# Patient Record
Sex: Female | Born: 1950 | Race: Asian | Hispanic: No | Smoking: Never smoker
Health system: Southern US, Community
[De-identification: ages and names within clinical notes are randomized; demographics above are authoritative.]

## PROBLEM LIST (undated history)

## (undated) DIAGNOSIS — R519 Headache, unspecified: Secondary | ICD-10-CM

## (undated) DIAGNOSIS — K219 Gastro-esophageal reflux disease without esophagitis: Secondary | ICD-10-CM

## (undated) DIAGNOSIS — I10 Essential (primary) hypertension: Secondary | ICD-10-CM

## (undated) HISTORY — PX: KIDNEY SURGERY: SHX687

## (undated) HISTORY — PX: BILATERAL OOPHORECTOMY: SHX1221

## (undated) HISTORY — DX: Headache, unspecified: R51.9

---

## 1998-12-21 ENCOUNTER — Ambulatory Visit (HOSPITAL_COMMUNITY): Admission: RE | Admit: 1998-12-21 | Discharge: 1998-12-21 | Payer: Self-pay | Admitting: Gastroenterology

## 1998-12-22 ENCOUNTER — Inpatient Hospital Stay (HOSPITAL_COMMUNITY): Admission: RE | Admit: 1998-12-22 | Discharge: 1998-12-25 | Payer: Self-pay | Admitting: *Deleted

## 2000-06-18 ENCOUNTER — Ambulatory Visit (HOSPITAL_COMMUNITY): Admission: RE | Admit: 2000-06-18 | Discharge: 2000-06-18 | Payer: Self-pay | Admitting: Gastroenterology

## 2002-01-31 ENCOUNTER — Other Ambulatory Visit: Admission: RE | Admit: 2002-01-31 | Discharge: 2002-01-31 | Payer: Self-pay | Admitting: *Deleted

## 2003-02-03 ENCOUNTER — Encounter: Admission: RE | Admit: 2003-02-03 | Discharge: 2003-05-04 | Payer: Self-pay | Admitting: Family Medicine

## 2004-03-10 ENCOUNTER — Other Ambulatory Visit: Admission: RE | Admit: 2004-03-10 | Discharge: 2004-03-10 | Payer: Self-pay | Admitting: Endocrinology

## 2004-06-06 ENCOUNTER — Observation Stay (HOSPITAL_COMMUNITY): Admission: EM | Admit: 2004-06-06 | Discharge: 2004-06-07 | Payer: Self-pay | Admitting: Emergency Medicine

## 2005-10-02 ENCOUNTER — Inpatient Hospital Stay (HOSPITAL_COMMUNITY): Admission: EM | Admit: 2005-10-02 | Discharge: 2005-10-06 | Payer: Self-pay | Admitting: Emergency Medicine

## 2005-10-03 ENCOUNTER — Encounter (INDEPENDENT_AMBULATORY_CARE_PROVIDER_SITE_OTHER): Payer: Self-pay | Admitting: Cardiology

## 2005-11-28 ENCOUNTER — Emergency Department (HOSPITAL_COMMUNITY): Admission: EM | Admit: 2005-11-28 | Discharge: 2005-11-28 | Payer: Self-pay | Admitting: Emergency Medicine

## 2010-08-18 ENCOUNTER — Emergency Department (HOSPITAL_BASED_OUTPATIENT_CLINIC_OR_DEPARTMENT_OTHER)
Admission: EM | Admit: 2010-08-18 | Discharge: 2010-08-18 | Payer: Self-pay | Source: Home / Self Care | Admitting: Emergency Medicine

## 2010-09-28 ENCOUNTER — Ambulatory Visit: Payer: Self-pay | Admitting: Physical Therapy

## 2010-10-05 ENCOUNTER — Ambulatory Visit: Payer: PRIVATE HEALTH INSURANCE | Attending: Family Medicine | Admitting: Physical Therapy

## 2010-10-05 DIAGNOSIS — M25569 Pain in unspecified knee: Secondary | ICD-10-CM | POA: Insufficient documentation

## 2010-10-05 DIAGNOSIS — M2569 Stiffness of other specified joint, not elsewhere classified: Secondary | ICD-10-CM | POA: Insufficient documentation

## 2010-10-05 DIAGNOSIS — IMO0001 Reserved for inherently not codable concepts without codable children: Secondary | ICD-10-CM | POA: Insufficient documentation

## 2010-10-05 DIAGNOSIS — M25559 Pain in unspecified hip: Secondary | ICD-10-CM | POA: Insufficient documentation

## 2010-10-10 ENCOUNTER — Ambulatory Visit: Payer: PRIVATE HEALTH INSURANCE | Admitting: Physical Therapy

## 2010-10-12 ENCOUNTER — Ambulatory Visit: Payer: PRIVATE HEALTH INSURANCE | Admitting: Physical Therapy

## 2010-10-17 ENCOUNTER — Ambulatory Visit: Payer: PRIVATE HEALTH INSURANCE | Admitting: Physical Therapy

## 2010-10-19 ENCOUNTER — Ambulatory Visit: Payer: PRIVATE HEALTH INSURANCE | Admitting: Physical Therapy

## 2010-10-24 ENCOUNTER — Ambulatory Visit: Payer: PRIVATE HEALTH INSURANCE | Attending: Family Medicine

## 2010-10-24 DIAGNOSIS — M25559 Pain in unspecified hip: Secondary | ICD-10-CM | POA: Insufficient documentation

## 2010-10-24 DIAGNOSIS — IMO0001 Reserved for inherently not codable concepts without codable children: Secondary | ICD-10-CM | POA: Insufficient documentation

## 2010-10-24 DIAGNOSIS — M2569 Stiffness of other specified joint, not elsewhere classified: Secondary | ICD-10-CM | POA: Insufficient documentation

## 2010-10-24 DIAGNOSIS — M25569 Pain in unspecified knee: Secondary | ICD-10-CM | POA: Insufficient documentation

## 2010-10-26 ENCOUNTER — Ambulatory Visit: Payer: PRIVATE HEALTH INSURANCE

## 2010-10-31 ENCOUNTER — Ambulatory Visit: Payer: PRIVATE HEALTH INSURANCE | Admitting: Physical Therapy

## 2010-11-02 ENCOUNTER — Ambulatory Visit: Payer: PRIVATE HEALTH INSURANCE | Admitting: Physical Therapy

## 2011-01-06 NOTE — Procedures (Signed)
EEG NUMBER:  [id number]   HISTORY:  This is a 60 year old patient who is being evaluated for a  syncopal event. This is a routine EEG. No skull defects were noted.  Medications include Lopressor, Protonix, Zocor, amitriptyline, Vasotec,  hydrochlorothiazide.   EEG CLASSIFICATION:  Dysrhythmia grade 1, left greater than right  hemisphere.   DESCRIPTION OF RECORDING:  The background rhythm of this recording consists  of fairly well modulated medium amplitude alpha rhythm of 8-9 Hz that is  reactive to eye opening and closure. As the record progresses, there appears  to be occasional episodes of generalized theta frequency slowing that occur  in bursts that are brief, 1-2 seconds, and at times are generalized and at  times seem to be a bit more prominent on the left hemisphere than right.  Photic stimulation is performed resulting in a bilateral and symmetric  photic driving response. Hyperventilation is also performed resulting in a  minimal bilateral buildup of the background rhythm activities. At no time  during the recording does there appear to be evidence of actual spikes or  spike wave discharges. EKG monitor shows no evidence of cardiac rhythm  abnormalities with a heart rate of 102.   IMPRESSION:  This is an abnormal EEG recording due to episodic theta  activity that is seen intermittently, at times more prominent in the left  hemisphere than the right. Such recording does not show true epileptiform  discharges; does suggest possible left brain abnormality. No seizure  discharges were seen.      Marlan Palau, M.D.  Electronically Signed     YNW:GNFA  D:  10/05/2005 15:44:14  T:  10/05/2005 17:02:36  Job #:  [id number]

## 2011-01-06 NOTE — Procedures (Signed)
[facility]  Patient:    Debbie Stevens, Debbie Stevens [facility]                     MRN: [id number] Proc. Date: 06/18/00 Adm. Date:  [id number] Attending:  Louie Bun                           Procedure Report  PROCEDURE:  Esophagogastroduodenoscopy.  INDICATION FOR PROCEDURE:  Persistent dyspepsia, recurrent off medication in a patient with a known history of duodenal ulcer and status post treatment for eradications of Helicobacter in the past.  DESCRIPTION OF PROCEDURE:  The patient was placed in the left lateral decubitus position and placed on the pulse monitor with continuous low-flow oxygen delivered by nasal cannula.  She was sedated with 50 mg IV Demerol and 7 mg IV Versed.  The Olympus video endoscope was advanced under direct vision into the oropharynx and esophagus.  The esophagus was straight and of normal caliber.  The squamocolumnar line at 38 cm.  There was no visible hiatal hernia, ring, stricture, esophagitis, or other abnormality at the GE junction. The stomach was entered, and a small amount of liquid secretions were suctioned from the fundus.  Retroflex view of the cardia was unremarkable. The fundus and body appeared normal.  With the antrum was seen some patchy erythema with granularity but no focal erosions, ulcers, or scarring.  The pylorus was nondeformed and easily allowed passage of the endoscope deep into the duodenum.  There was some moderate duodenal bulb cloverleaf deformity but no active ulceration or erosion.  The scope easily passed into the second portion of the duodenum which appeared normal.  The scope was withdrawn into the stomach, and a CLOtest obtained.  The scope was then withdrawn, and the patient returned to the recovery room in stable condition.  She tolerated the procedure well, and there were no immediate complications.  IMPRESSION: 1. Mild antral gastritis. 2. Cloverleaf deformity of the duodenal bulb consistent with  prior peptic    ulcer disease.  PLAN:  Await CLOtest and retreat for eradication if Helicobacter with a Flagyl-based regimen if CLOtest positive.  Otherwise continue proton pump inhibitor.  If dyspepsia continues, will consider ultrasound of the liver, gallbladder, and pancreas. DD:  06/18/00 TD:  06/18/00 Job: [id number]

## 2011-01-06 NOTE — H&P (Signed)
NAMEAARIANNA, Stevens.:  [id number]   MEDICAL RECORD NO.:  [id number]          PATIENT TYPE:  EMS   LOCATION:  MAJO                         FACILITY:  [facility]   PHYSICIAN:  Sherin Quarry, MD      DATE OF BIRTH:  July 04, 1951   DATE OF ADMISSION:  10/02/2005  DATE OF DISCHARGE:                                HISTORY & PHYSICAL   Debbie Stevens is a 60 year old lady who is generally in good health. According to  her daughter, over the last 3 months they have been monitoring of Mrs.  Stevens's blood pressure, and reportedly the diastolic component the blood  pressure was consistently in the range of 95-100. About 6 weeks ago, they  had mentioned this to Dr. Theresia Lo, and he had increased her Toprol dosage  from 50-100 mg daily. Other medications were not changed. Subsequently  according to the daughter, Dr. Theresia Lo had expressed concern about Mrs.  Shippee's thyroid status. Unfortunately, daughter was not present when this  discussion occurred, and Debbie Stevens is not clear about exactly what was  discussed. In any case, it was decided to proceed with a thyroid scan. This  procedure was scheduled for today. Prior to the thyroid scan, Debbie Stevens was  given a tablet, presumably radioiodine, to take, and she was told to go home  and return for the x-ray study. About 30 minutes after taking the tablets,  she indicates that her heart rate increased, and she felt dizzy. She was  hungry and ate a snack. She then said that the dizziness was persisting and  stood up. At that point she said that she felt very weak and sat down on the  floor. Her daughter sat with her and reports that she spontaneously became  diaphoretic and then appeared to lose consciousness. When her daughter  examined her, her eyes had rolled back in her head. She had some grinding of  her teeth which may have represented seizure activity. She would  intermittently regain consciousness when her daughter stimulated her.  This  episode went on for about 15 minutes. When the ambulance arrived, apparently  she was back to her normal status except for being very tired. During the  course of this problem, there was no headache, no visual blurring, no  apparent respiratory distress. No coughing. She has had no chest pain,  although she describes a feeling of pressure or heaviness which she  localizes the epigastric area. There was no vomiting. No hematemesis or  melena. She was transported to the emergency room, arriving about 5 hours  ago. At that time, her blood pressure was 149/63, heart rate was noted to be  104, O2 saturation was 99%. Electrocardiogram was obtained which shows a  normal sinus rhythm with no arrhythmias. No evidence of ischemia or  hypertrophy. Laboratory studies obtained included a sodium 135, potassium  4.3, glucose 116. Hemoglobin was 16.3. Creatinine 1.1. Initial point of care  enzymes were negative. Urinalysis was negative. The chest x-ray was within  normal limits. The CT scan of the brain was completely  normal. Debbie Stevens  will be admitted to the hospital for further evaluation of this syncopal  episode   PAST MEDICAL HISTORY:   CURRENT MEDICATIONS:  Consist of:  1.  Lipitor or 10 mg daily.  2.  Prevacid 30 mg daily.  3.  Toprol XL 50 mg twice daily.  4.  Amitriptyline 825 mg 3 times a day.  5.  Enalapril 5/12.5 one daily.   There are no known drug allergies.   OPERATIONS:  She has had a previous hysterectomy and bilateral salpingo-  oophorectomy. She also had a nephrectomy 18 years ago because of kidney  stones.   MEDICAL ILLNESSES:  1.  Hypertension. The patient has a 6-year history of hypertension which is      generally well regulated. Since her Toprol dosage was increased, her      blood pressure has been consistently in the range of 120-130/70-80.  2.  Acid reflux. Debbie Stevens reports having episodes of epigastric discomfort      for which she takes Prevacid. She says  that lately she has noted that      she has had several episodes where she felt like there was a significant      acid reflux.   FAMILY HISTORY:  The patient has several siblings with hypertension.   SOCIAL HISTORY:  She does not smoke. She does not abuse alcohol or drugs.  She works as a Neurosurgeon.   REVIEW OF SYSTEMS:  HEAD:  She denies headache, otherwise see above. She  denies diplopia. There has been no visual blurring. There has been no  earache, sinus pain or sore throat. CHEST:  She denies coughing wheezing,  chest congestion. CARDIOVASCULAR:  Denies orthopnea, PND, ankle edema,  exertional chest pain. Note that she was hospitalized in October 2005 with  episode of chest pain, but I gather that workup subsequently was negative.  There is not much information on the computer about this hospitalization.  GI: See above. Otherwise she denies hematemesis, abdominal pain, melena or  diarrhea. GU: Denies dysuria or urinary frequency. NEUROLOGIC:  There is no  history of seizure or stroke. ENDOCRINE: Denies excessive thirst, urinary  frequency or nocturia.   PHYSICAL EXAMINATION:  GENERAL:  She is a pleasant cooperative lady who does  not speak [place]. She appears to be a little bit fatigued, but she is able  to follow commands and has no specific complaints.  HEENT: Exam is within normal limits.  CHEST: The chest is clear.  BACK: Examination the back reveals no CVA or point tenderness.  CARDIOVASCULAR On Exam, S1-S2 is seen to be slightly increased. The heart  rate is increased at about 110. It is regular. There are no rubs or gallops.  ABDOMEN: The abdomen is benign. There are normal bowel sounds. No masses or  tenderness. No guarding or rebound.  NEUROLOGIC:  Testing is within normal limits. Cranial nerves, motor, sensory  and cerebellar testing is normal. Station and gait was not tested.  EXTREMITIES: Examination extremities reveals no cyanosis or edema   IMPRESSION:  1.   Syncopal episode in close proximity to a thyroid scan.  2.  Sinus tachycardia.  3.  Possible thyroid disorder, details uncertain.  4.  History of hypertension.  5.  Gastroesophageal reflux.  6.  Status post hysterectomy.   PLAN:  Will admit the patient to the hospital for evaluation. The patient's  tachycardia and diaphoresis would suggest the possibility of  hyperthyroidism. We will need to get more details  from Dr. Theresia Lo about  her previous workup. I will reassess her thyroid function at this time. We  will obtain serial cardiac enzymes to rule out MI. I will  give her some intravenous fluids and continue her beta blocker. A 2-D echo  will be obtained. If workup is otherwise unremarkable, conceivably a  neurology consult might be worthwhile. Will also try to determine from  nuclear medicine if it is possible that she could have had a reaction to the  radionuclide.           ______________________________  Sherin Quarry, MD     SY/MEDQ  D:  10/02/2005  T:  10/02/2005  Job:  [id number]   cc:   Vikki Ports, M.D.  Fax: [phone]

## 2011-01-06 NOTE — H&P (Signed)
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  She presents with a history of onset yesterday  evening of diarrhea, nausea and vomiting shortly after eating.  After this  had been going on for a couple of hours she began to experience a dull  constant chest pressure associated with persistent nausea.  There was no  associated breathing difficulty or diaphoresis.  Although the diarrhea and  vomiting gradually resolved, the patient continued to experience chest  pressure and eventually came to the [facility] Emergency Room at about 5  p.m.  At that time she was given aspirin as well as a total of two  sublingual nitroglycerin.  The nitroglycerin did not clearly benefit the  chest pain but it did gradually resolved.  At this time she is painfree.  She reports that whenever she lifts anything heavy she will experience a  feeling of chest heaviness.  She normally walks on a treadmill about 25  minutes each day and does not experience any shortness of breath or chest  pain.  It is somewhat difficult to get a history from the patient as she  does not speak English, fortunately, we are assisted by her daughter.  Of  note is that the patient has a longstanding history of hypertension.   PAST MEDICAL HISTORY:  1.  Medications:      1.  Vaseretic 5/12.5 one daily.      2.  Toprol XL 50 mg daily.      3.  Elavil 25 mg t.i.d.      4.  Prevacid 30 mg daily.      5.  Vitamin one daily.      6.  Caltrate two tablets daily.  2.  Allergies:  There are no known drug allergies.  3.  Operations:  The  patient has had a previous hysterectomy and bilateral      salpingo-oophorectomy.  She had a nephrectomy about 18 years ago because      of kidney stones.  4.  Medical illnesses:      1.  Hypertension.  The patient has a 4-year history of hypertension          which is said to be reasonably well regulated.  There is no previous          history of heart disease or stroke.      2.  Acid reflux.  The patient has chronic difficulty with midepigastric          burning and sometimes will experience difficulty swallowing with a  feeling that food is hung up in her esophagus.   FAMILY HISTORY:  The patient has several siblings who have hypertension.   SOCIAL HISTORY:  She never smokes.  She denies alcohol use.  She does not  abuse drugs.  As mentioned previously she works as a Neurosurgeon.   REVIEW OF SYSTEMS:  HEAD:  She denies headache or dizziness.  EYES:  She  denies visual blurring or diplopia.  EAR/NOSE AND THROAT:  Denies earache,  sinus pain, or sore throat.  CHEST:  Denies coughing, wheezing, or chest  congestion.  CARDIOVASCULAR:  See above.  She denies orthopnea or PND.  There has been no ankle edema.  GI:  See above.  There has been no  hematemesis or melena.  GU:  She denies dysuria or urinary frequency.  NEURO:  There is no history of seizure or stroke.  ENDO:  She denies  excessive thirst, urinary frequency, nocturia, heat or cold intolerance,  skin or hair changes.   PHYSICAL EXAMINATION:  VITAL SIGNS:  Her blood pressure is 150/100, pulse is  92, respirations 12, O2 saturations 100%.  HEENT:  Within normal limits.  CHEST:  Clear.  CARDIOVASCULAR:  Normal S1 and S2 without rubs, murmurs, or gallops.  ABDOMEN:  Benign.  There are normal bowel sounds without masses or  tenderness.  There is no guarding or rebound.  NEUROLOGIC TESTING/EXAMINATION OF EXTREMITIES:  Normal.   ELECTROCARDIOGRAM:  Evidence of tachycardia.   LABORATORIES:  White count is 10,200, the  prothrombin time is normal,  hemoglobin is 16.4, potassium is 3.2, creatinine is 1.2, BUN 37, liver  profile is normal, the initial troponin is negative.   IMPRESSION:  1.  Atypical chest pain rule out myocardial infarction.  2.  Probable gastroesophageal reflux with symptoms suggestive of an      esophageal stricture.  3.  Hypertension.  4.  Family history of hypertension.  5.  Mild hypokalemia.   PLAN:  I think it would be prudent to go ahead and admit the patient at this  time to rule out myocardial infarction.  I suspect the patient is relatively  dehydrated secondary to diarrhea.  We will give her intravenous fluids and  if the diarrhea persists it would probably be a good idea to get stool  studies.  The patient's potassium level will be repleted.  Eventually she  will need a stress test to evaluate her symptoms.  It should be noted that  she tolerates walking on a treadmill without difficulty.  Also it sounds  like she has pretty significant problems with epigastric pain and dysphagia  and might benefit from another endoscopy.       SY/MEDQ  D:  06/06/2004  T:  06/06/2004  Job:  [id number]   cc:   Vikki Ports, M.D.  [address]  [place]  [place] [address]  Fax: [phone]

## 2011-01-06 NOTE — Discharge Summary (Signed)
Debbie Stevens, Debbie Stevens NO.:  [id number]   MEDICAL RECORD NO.:  [id number]          PATIENT TYPE:  INP   LOCATION:  4735                         FACILITY:  [facility]   PHYSICIAN:  Kela Millin, M.D.DATE OF BIRTH:  July 10, 1951   DATE OF ADMISSION:  10/02/2005  DATE OF DISCHARGE:  10/06/2005                                 DISCHARGE SUMMARY   DISCHARGE DIAGNOSES:  1.  Syncope - etiology unclear, question medication induced.  2.  Hypertension.  3.  Gastroesophageal reflux disease.  4.  History of nephrolithiasis.  5.  Status post nephrectomy.   STUDIES:  1.  CT scan of the head - no acute intracranial abnormality.  2.  MRI of the brain on October 06, 2005 - no acute intracranial      abnormalities.  3.  EEG on October 05, 2005 - abnormal EEG recording due to episodic theta      activity then intermittently at times more prominent in the left      hemisphere than the right. No true epileptiform discharges. No seizure      discharges seen.  4.  A 2D echocardiogram - overall left ventricular systolic function normal.      No left ventricular regional wall motion abnormalities.  5.  Carotid Doppler ultrasound - no ICA stenosis bilaterally. Vertebral      artery flow antegrade bilaterally.   HISTORY:  The patient is a 60 year old [place] lady who presented  following a syncopal episode. It was noted that the patient had generally  been in good health and according to her daughter, in the past 3 months  blood pressures have been monitored by her primary care physician and her  diastolic blood pressures were consistently in the 95-100 range. About 6  weeks prior to this presentation, they mentioned this to Dr. Theresia Lo and  the patient's Toprol dosage was increased. Subsequently Dr. Theresia Lo also  was concerned about Debbie Stevens's thyroid status, so a thyroid scan was  ordered. This study was scheduled on the day of presentation and prior to  the scan the patient  was given a tablet, presumably radio-iodine to take and  she was told to go home and return for the x-ray study. About 30 minutes  after taking the tablet she reported and increase in her heart rate and felt  dizzy. Even after eating the dizziness persisted, so she stood up and at  that point she felt very weak and sat down on the floor. Per her daughter,  she became very diaphoretic and appeared to lose consciousness. Her daughter  reported that when she examined her, her eyes were rolled back in her head  and she had some grinding of her teeth, and the questioned seizure activity.  This episode lasted about 15 minutes during which time the patient  intermittently would gain consciousness when stimulated by her daughter. The  ambulance was called and the patient was transported to the ER. The patient  denied chest pain, she admitted to some heaviness in her epigastric area.  She denied nausea, vomiting, hematemesis.  In the ER her blood pressure was  noted to be 149/63 with a pulse of 104, O2 saturation of 99%. An EKG showed  normal sinus rhythm with no arrhythmias.  She was admitted to the [facility] service for further evaluation and  management.   PHYSICAL EXAMINATION:  VITAL SIGNS:  Upon admission as per Dr. Tresa Endo,  revealed a blood pressure of 149/63 with a pulse of 104.  GENERAL:  She was pleasant, appeared a little bit fatigued and was able to  follow commands. The rest of her exam was noted to be within normal limits.   LABORATORY DATA:  Imaging studies as already stated above, her sodium was  135 with a potassium of 4.3, glucose of 116.  Hemoglobin 16.3. Creatinine 1.1. Cardiac enzymes were negative. Urinalysis  was negative for infection.  Chest x-ray was within normal limits.   HOSPITAL COURSE:  PROBLEM #1. Syncope - upon admission the patient had  serial cardiac enzymes done and these were negative. Orthostatic vital signs  were also done in the hospital and she  was not orthostatic. The patient also  had thyroid function test done, her TSH was normal at 1.033 and her T3  uptake was slightly elevated at 40.3 and a T4 was normal at 7.2. The work up  for the syncopal episode in the hospital included a 2D echo which was within  normal limits as stated above, also carotid Doppler ultrasound showed no  internal carotid artery stenosis. She had an EEG done with results as stated  above. Following this EEG finding, I discussed the patient with neurology  and their recommendation was to do an MRI as a follow-up to rule out a  stroke. An MRI was done and it showed no acute intracranial findings. The  patient did not have any syncopal episodes throughout her hospital stay. Her  blood pressures were monitored during her hospital stay and she was  maintained on Lopressor 50 p.o. b.i.d. and her blood pressures ranged from  99/61 to 124/68. It was noted that the patient's home dose was Toprol XL 50  in the a.m. and Toprol XL 100 in the p.m. The patient was instructed to  discontinue the p.m. Toprol of 100 mg and just take Toprol XL 50 mg q.a.m.  She is to follow up with her primary care physicians and have blood  pressures monitored and the doses adjusted as appropriate. It is likely that  if the patient was actually taking Toprol XL 50 in a.m. and 100 in p.m.,  that she might have become hypotensive (given that her blood pressures were  in the optimal range in the hospital on just 50 of metoprolol b.i.d. along  with her Vasoretic). The other possibility is that the patient might have  had a reaction to the radio-iodine tablet that she was given. I discussed  this with the radiologist and he stated that this was very unlikely because  the dose given for thyroid scan is a very small dose.   PROBLEM #2. Hypertension - patient is to continue Toprol 50 mg daily as  discussed above as well as Vaseretic and follow up with her primary care physician.   PROBLEM #3.  Gastroesophageal reflux disease - patient is to continue  Prevacid upon discharge.   PROBLEM #4. Question of thyroid disease - patient is to follow up with her  primary care physician and with endocrinology consultation as appropriate.   DISCHARGE MEDICATIONS:  1.  Toprol XL 50 mg daily.  2.  Prevacid 30 mg daily.  3.  Lipitor 10 mg daily.  4.  Vaseretic 5/12.5 mg daily.  5.  Amitriptyline 25 mg p.o.  6.  Multivitamin daily.  7.  Calcium daily.   FOLLOW UP CARE:  Dr. Theresia Lo in 1 week and as needed.   DISCHARGE CONDITION:  Improved/stable.      Kela Millin, M.D.  Electronically Signed     ACV/MEDQ  D:  10/06/2005  T:  10/06/2005  Job:  [id number]   cc:   Vikki Ports, M.D.  Fax: [phone]

## 2011-04-24 ENCOUNTER — Emergency Department (INDEPENDENT_AMBULATORY_CARE_PROVIDER_SITE_OTHER): Payer: PRIVATE HEALTH INSURANCE

## 2011-04-24 ENCOUNTER — Other Ambulatory Visit: Payer: Self-pay

## 2011-04-24 ENCOUNTER — Emergency Department (HOSPITAL_BASED_OUTPATIENT_CLINIC_OR_DEPARTMENT_OTHER)
Admission: EM | Admit: 2011-04-24 | Discharge: 2011-04-24 | Disposition: A | Discharge status: Home / Self Care | Payer: PRIVATE HEALTH INSURANCE | Attending: Emergency Medicine | Admitting: Emergency Medicine

## 2011-04-24 ENCOUNTER — Encounter: Payer: Self-pay | Admitting: *Deleted

## 2011-04-24 DIAGNOSIS — I10 Essential (primary) hypertension: Secondary | ICD-10-CM | POA: Insufficient documentation

## 2011-04-24 DIAGNOSIS — R002 Palpitations: Secondary | ICD-10-CM | POA: Insufficient documentation

## 2011-04-24 HISTORY — DX: Essential (primary) hypertension: I10

## 2011-04-24 LAB — TROPONIN I: Troponin I: <0.3 ng/mL (ref <0.30)

## 2011-04-24 LAB — BASIC METABOLIC PANEL
Calcium: 9.9 mg/dL (ref 8.4–10.5)
Chloride: 99 mEq/L (ref 96–112)
Creatinine, Ser: 0.8 mg/dL (ref 0.50–1.10)
GFR calc non Af Amer: >60 mL/min (ref >60)

## 2011-04-24 LAB — CBC
HCT: 40.4 % (ref 36.0–46.0)
MCV: 84.2 fL (ref 78.0–100.0)
WBC: 5.9 10*3/uL (ref 4.0–10.5)

## 2011-04-24 NOTE — ED Provider Notes (Signed)
History     CSN: [id number] Arrival date & time: 04/24/2011  5:03 AM  Chief Complaint  Patient presents with  . Hypertension   Patient is a 60 y.o. female presenting with hypertension. The history is provided by the patient and a relative.  Hypertension This is a chronic problem. The current episode started 12 to 24 hours ago. The problem occurs constantly. The problem has not changed since onset.Pertinent negatives include no chest pain, no abdominal pain, no headaches and no shortness of breath. The symptoms are aggravated by nothing. The symptoms are relieved by nothing. She has tried nothing for the symptoms.   the patient reports that she noticed today that her blood pressure was running high, with a systolic blood pressure ranging from 150-160 mmHg. She denies any symptoms associated with this, including shortness of breath, or chest pain, but reports feeling like her heart was racing, with her measuring a heart rate of 100 beats per minute at times during the day which concerned her. She denies palpitations or fluttering in her chest. She denies nausea, vomiting, headache, visual changes, or other symptoms associated with the high blood pressure. She reports that she has been taking her antihypertensive medications as prescribed.  Past Medical History  Diagnosis Date  . Hypertension     No past surgical history on file.  No family history on file.  History  Substance Use Topics  . Smoking status: Never Smoker   . Smokeless tobacco: Not on file  . Alcohol Use: No    OB History    Grav Para Term Preterm Abortions TAB SAB Ect Mult Living                  Review of Systems  Constitutional: Negative for diaphoresis, activity change and fatigue.  HENT: Negative for ear pain, nosebleeds and tinnitus.   Eyes: Negative.  Negative for pain and visual disturbance.  Respiratory: Negative for chest tightness, shortness of breath and wheezing.   Cardiovascular: Negative for chest  pain, palpitations and leg swelling.  Gastrointestinal: Negative for abdominal pain.  Musculoskeletal: Negative.   Skin: Negative.   Neurological: Negative for dizziness, tremors, syncope, weakness, light-headedness and headaches.  Psychiatric/Behavioral: Negative.     Physical Exam  BP 162/91  Pulse 71  Resp 18  SpO2 97%  Physical Exam  Nursing note and vitals reviewed. Constitutional: She is oriented to person, place, and time. She appears well-nourished. No distress.  HENT:  Head: Normocephalic and atraumatic.  Mouth/Throat: Oropharynx is clear and moist.  Eyes: Pupils are equal, round, and reactive to light.  Neck: Neck supple. No JVD present.  Cardiovascular: Normal rate, regular rhythm, normal heart sounds and intact distal pulses.   No extrasystoles are present. PMI is not displaced.  Exam reveals no gallop and no friction rub.   No murmur heard. Pulmonary/Chest: Effort normal. No respiratory distress. She has no wheezes. She has no rales. She exhibits no tenderness.  Abdominal: Soft. Bowel sounds are normal. There is no tenderness.  Musculoskeletal: Normal range of motion. She exhibits no edema and no tenderness.  Neurological: She is alert and oriented to person, place, and time. No cranial nerve deficit. Coordination normal.  Skin: Skin is warm and dry. No rash noted. She is not diaphoretic. No erythema. No pallor.  Psychiatric: She has a normal mood and affect. Her behavior is normal. Judgment and thought content normal.    ED Course  Procedures  MDM Essential hypertension, tachycardia, anemia, electrolyte abnormality, cardiomegaly all  entertained the patient's differential diagnosis. The patient has a normal physical exam and a normal EKG, and review of her chest x-ray and her laboratory studies are likewise normal. Her hypertension is not severe to the point where as an emergency physician I feel the need to intervene and change her medication regimen. I feel this  would be best managed by her primary care physician who is more familiar with her entire medical history and is coordinating the rest of her care. I explained this to the patient and her daughter who states her understanding of and agreement with this plan of care.  Results for orders placed during the hospital encounter of 04/24/11  CBC      Component Value Range   WBC 5.9  4.0 - 10.5 (K/uL)   RBC 4.80  3.87 - 5.11 (MIL/uL)   Hemoglobin 14.0  12.0 - 15.0 (g/dL)   HCT 78.2  95.6 - 21.3 (%)   MCV 84.2  78.0 - 100.0 (fL)   MCH 29.2  26.0 - 34.0 (pg)   MCHC 34.7  30.0 - 36.0 (g/dL)   RDW 08.6  57.8 - 46.9 (%)   Platelets 215  150 - 400 (K/uL)  BASIC METABOLIC PANEL      Component Value Range   Sodium 136  135 - 145 (mEq/L)   Potassium 3.3 (*) 3.5 - 5.1 (mEq/L)   Chloride 99  96 - 112 (mEq/L)   CO2 27  19 - 32 (mEq/L)   Glucose, Bld 130 (*) 70 - 99 (mg/dL)   BUN 18  6 - 23 (mg/dL)   Creatinine, Ser 6.29  0.50 - 1.10 (mg/dL)   Calcium 9.9  8.4 - 52.8 (mg/dL)   GFR calc non Af Amer >60  >60 (mL/min)   GFR calc Af Amer >60  >60 (mL/min)  TROPONIN I      Component Value Range   Troponin I <0.30  <0.30 (ng/mL)   Dg Chest 2 View  04/24/2011  *RADIOLOGY REPORT*  Clinical Data: Palpitations.  Hypertension.  CHEST - 2 VIEW  Comparison: 10/02/2005  Findings: Borderline heart size with normal pulmonary vascularity. No focal airspace consolidation in the lungs.  No blunting of costophrenic angles.  No pneumothorax.  Tortuous aorta.  Surgical clips in the right upper quadrant.  No significant changes since the previous study.  IMPRESSION: No evidence of active pulmonary disease.  Original Report Authenticated By: Marlon Pel, M.D.   Date: 04/24/2011  Rate: 61   Rhythm: normal sinus rhythm  QRS Axis: normal  Intervals: PR prolonged  ST/T Wave abnormalities: normal  Conduction Disutrbances:none  Narrative Interpretation: Non-provocative EKG  Old EKG Reviewed:  unchanged        Felisa Bonier, MD 04/24/11 [phone]

## 2011-04-24 NOTE — ED Notes (Signed)
Patient states her BP has been high yesterday and today.

## 2013-11-11 ENCOUNTER — Encounter (HOSPITAL_COMMUNITY): Payer: Self-pay | Admitting: Emergency Medicine

## 2013-11-11 ENCOUNTER — Emergency Department (HOSPITAL_COMMUNITY)
Admission: EM | Admit: 2013-11-11 | Discharge: 2013-11-11 | Disposition: A | Discharge status: Home / Self Care | Payer: No Typology Code available for payment source | Attending: Emergency Medicine | Admitting: Emergency Medicine

## 2013-11-11 ENCOUNTER — Emergency Department (HOSPITAL_COMMUNITY): Payer: No Typology Code available for payment source

## 2013-11-11 DIAGNOSIS — S8990XA Unspecified injury of unspecified lower leg, initial encounter: Secondary | ICD-10-CM | POA: Insufficient documentation

## 2013-11-11 DIAGNOSIS — S99929A Unspecified injury of unspecified foot, initial encounter: Secondary | ICD-10-CM

## 2013-11-11 DIAGNOSIS — Z791 Long term (current) use of non-steroidal anti-inflammatories (NSAID): Secondary | ICD-10-CM | POA: Insufficient documentation

## 2013-11-11 DIAGNOSIS — Y92410 Unspecified street and highway as the place of occurrence of the external cause: Secondary | ICD-10-CM | POA: Insufficient documentation

## 2013-11-11 DIAGNOSIS — I10 Essential (primary) hypertension: Secondary | ICD-10-CM | POA: Insufficient documentation

## 2013-11-11 DIAGNOSIS — S99919A Unspecified injury of unspecified ankle, initial encounter: Secondary | ICD-10-CM

## 2013-11-11 DIAGNOSIS — S79919A Unspecified injury of unspecified hip, initial encounter: Secondary | ICD-10-CM | POA: Insufficient documentation

## 2013-11-11 DIAGNOSIS — R0789 Other chest pain: Secondary | ICD-10-CM

## 2013-11-11 DIAGNOSIS — M25552 Pain in left hip: Secondary | ICD-10-CM

## 2013-11-11 DIAGNOSIS — S79929A Unspecified injury of unspecified thigh, initial encounter: Secondary | ICD-10-CM

## 2013-11-11 DIAGNOSIS — Z79899 Other long term (current) drug therapy: Secondary | ICD-10-CM | POA: Insufficient documentation

## 2013-11-11 DIAGNOSIS — M25562 Pain in left knee: Secondary | ICD-10-CM

## 2013-11-11 DIAGNOSIS — S298XXA Other specified injuries of thorax, initial encounter: Secondary | ICD-10-CM | POA: Insufficient documentation

## 2013-11-11 DIAGNOSIS — Y9389 Activity, other specified: Secondary | ICD-10-CM | POA: Insufficient documentation

## 2013-11-11 MED ORDER — NAPROXEN 500 MG PO TABS: 500.0000 mg | ORAL_TABLET | Freq: Two times a day (BID) | ORAL | Status: DC

## 2013-11-11 NOTE — Discharge Instructions (Signed)
X-rays were all normal. You'll be sore for several days. Medication for pain
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
## 2013-11-11 NOTE — ED Notes (Signed)
Pt dc to home. Pt sts understanding to dc instructions. Pt ambulatory to exit with family. Pt denies need for w/c
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
## 2013-11-11 NOTE — ED Notes (Signed)
Patient returned from radiology

## 2015-10-13 DIAGNOSIS — Z0000 Encounter for general adult medical examination without abnormal findings: Secondary | ICD-10-CM | POA: Diagnosis not present

## 2015-10-13 DIAGNOSIS — I10 Essential (primary) hypertension: Secondary | ICD-10-CM | POA: Diagnosis not present

## 2015-10-13 DIAGNOSIS — E785 Hyperlipidemia, unspecified: Secondary | ICD-10-CM | POA: Diagnosis not present

## 2015-10-13 DIAGNOSIS — Z23 Encounter for immunization: Secondary | ICD-10-CM | POA: Diagnosis not present

## 2015-11-12 DIAGNOSIS — R05 Cough: Secondary | ICD-10-CM | POA: Diagnosis not present

## 2015-12-15 DIAGNOSIS — J302 Other seasonal allergic rhinitis: Secondary | ICD-10-CM | POA: Diagnosis not present

## 2016-04-18 DIAGNOSIS — I10 Essential (primary) hypertension: Secondary | ICD-10-CM | POA: Diagnosis not present

## 2016-04-18 DIAGNOSIS — F419 Anxiety disorder, unspecified: Secondary | ICD-10-CM | POA: Diagnosis not present

## 2016-04-18 DIAGNOSIS — Z789 Other specified health status: Secondary | ICD-10-CM | POA: Diagnosis not present

## 2016-04-18 DIAGNOSIS — E785 Hyperlipidemia, unspecified: Secondary | ICD-10-CM | POA: Diagnosis not present

## 2016-04-18 DIAGNOSIS — Z23 Encounter for immunization: Secondary | ICD-10-CM | POA: Diagnosis not present

## 2016-05-10 DIAGNOSIS — I10 Essential (primary) hypertension: Secondary | ICD-10-CM | POA: Diagnosis not present

## 2016-05-10 DIAGNOSIS — F419 Anxiety disorder, unspecified: Secondary | ICD-10-CM | POA: Diagnosis not present

## 2016-05-23 DIAGNOSIS — I10 Essential (primary) hypertension: Secondary | ICD-10-CM | POA: Diagnosis not present

## 2016-05-23 DIAGNOSIS — R682 Dry mouth, unspecified: Secondary | ICD-10-CM | POA: Diagnosis not present

## 2016-05-24 DIAGNOSIS — I10 Essential (primary) hypertension: Secondary | ICD-10-CM | POA: Diagnosis not present

## 2016-05-29 DIAGNOSIS — I10 Essential (primary) hypertension: Secondary | ICD-10-CM | POA: Diagnosis not present

## 2016-06-05 DIAGNOSIS — K219 Gastro-esophageal reflux disease without esophagitis: Secondary | ICD-10-CM | POA: Diagnosis not present

## 2016-06-05 DIAGNOSIS — Z1211 Encounter for screening for malignant neoplasm of colon: Secondary | ICD-10-CM | POA: Diagnosis not present

## 2016-06-05 DIAGNOSIS — R634 Abnormal weight loss: Secondary | ICD-10-CM | POA: Diagnosis not present

## 2016-06-05 DIAGNOSIS — R6881 Early satiety: Secondary | ICD-10-CM | POA: Diagnosis not present

## 2016-06-07 DIAGNOSIS — Z1231 Encounter for screening mammogram for malignant neoplasm of breast: Secondary | ICD-10-CM | POA: Diagnosis not present

## 2016-06-09 DIAGNOSIS — I10 Essential (primary) hypertension: Secondary | ICD-10-CM | POA: Diagnosis not present

## 2016-06-09 DIAGNOSIS — F411 Generalized anxiety disorder: Secondary | ICD-10-CM | POA: Diagnosis not present

## 2016-06-21 DIAGNOSIS — I10 Essential (primary) hypertension: Secondary | ICD-10-CM | POA: Diagnosis not present

## 2016-06-21 DIAGNOSIS — F419 Anxiety disorder, unspecified: Secondary | ICD-10-CM | POA: Diagnosis not present

## 2016-07-04 DIAGNOSIS — K2970 Gastritis, unspecified, without bleeding: Secondary | ICD-10-CM | POA: Diagnosis not present

## 2016-07-04 DIAGNOSIS — Z1211 Encounter for screening for malignant neoplasm of colon: Secondary | ICD-10-CM | POA: Diagnosis not present

## 2016-07-04 DIAGNOSIS — R140 Abdominal distension (gaseous): Secondary | ICD-10-CM | POA: Diagnosis not present

## 2016-07-04 DIAGNOSIS — K2930 Chronic superficial gastritis without bleeding: Secondary | ICD-10-CM | POA: Diagnosis not present

## 2016-07-04 DIAGNOSIS — R6881 Early satiety: Secondary | ICD-10-CM | POA: Diagnosis not present

## 2016-07-11 DIAGNOSIS — K2930 Chronic superficial gastritis without bleeding: Secondary | ICD-10-CM | POA: Diagnosis not present

## 2016-07-12 DIAGNOSIS — I10 Essential (primary) hypertension: Secondary | ICD-10-CM | POA: Diagnosis not present

## 2016-07-12 DIAGNOSIS — F411 Generalized anxiety disorder: Secondary | ICD-10-CM | POA: Diagnosis not present

## 2016-10-23 DIAGNOSIS — Z1159 Encounter for screening for other viral diseases: Secondary | ICD-10-CM | POA: Diagnosis not present

## 2016-10-23 DIAGNOSIS — F411 Generalized anxiety disorder: Secondary | ICD-10-CM | POA: Diagnosis not present

## 2016-10-23 DIAGNOSIS — Z0000 Encounter for general adult medical examination without abnormal findings: Secondary | ICD-10-CM | POA: Diagnosis not present

## 2016-10-23 DIAGNOSIS — E7800 Pure hypercholesterolemia, unspecified: Secondary | ICD-10-CM | POA: Diagnosis not present

## 2016-10-23 DIAGNOSIS — Z789 Other specified health status: Secondary | ICD-10-CM | POA: Diagnosis not present

## 2016-10-23 DIAGNOSIS — I10 Essential (primary) hypertension: Secondary | ICD-10-CM | POA: Diagnosis not present

## 2017-04-25 DIAGNOSIS — L309 Dermatitis, unspecified: Secondary | ICD-10-CM | POA: Diagnosis not present

## 2017-04-25 DIAGNOSIS — F411 Generalized anxiety disorder: Secondary | ICD-10-CM | POA: Diagnosis not present

## 2017-04-25 DIAGNOSIS — Z23 Encounter for immunization: Secondary | ICD-10-CM | POA: Diagnosis not present

## 2017-04-25 DIAGNOSIS — I10 Essential (primary) hypertension: Secondary | ICD-10-CM | POA: Diagnosis not present

## 2017-04-25 DIAGNOSIS — E7800 Pure hypercholesterolemia, unspecified: Secondary | ICD-10-CM | POA: Diagnosis not present

## 2017-06-14 DIAGNOSIS — H01004 Unspecified blepharitis left upper eyelid: Secondary | ICD-10-CM | POA: Diagnosis not present

## 2017-06-14 DIAGNOSIS — H04123 Dry eye syndrome of bilateral lacrimal glands: Secondary | ICD-10-CM | POA: Diagnosis not present

## 2017-06-14 DIAGNOSIS — H01001 Unspecified blepharitis right upper eyelid: Secondary | ICD-10-CM | POA: Diagnosis not present

## 2017-06-14 DIAGNOSIS — H2513 Age-related nuclear cataract, bilateral: Secondary | ICD-10-CM | POA: Diagnosis not present

## 2017-06-14 DIAGNOSIS — H25043 Posterior subcapsular polar age-related cataract, bilateral: Secondary | ICD-10-CM | POA: Diagnosis not present

## 2017-06-14 DIAGNOSIS — H25013 Cortical age-related cataract, bilateral: Secondary | ICD-10-CM | POA: Diagnosis not present

## 2017-06-15 DIAGNOSIS — H04123 Dry eye syndrome of bilateral lacrimal glands: Secondary | ICD-10-CM | POA: Insufficient documentation

## 2017-06-15 DIAGNOSIS — H2513 Age-related nuclear cataract, bilateral: Secondary | ICD-10-CM | POA: Insufficient documentation

## 2017-06-15 DIAGNOSIS — H25043 Posterior subcapsular polar age-related cataract, bilateral: Secondary | ICD-10-CM | POA: Insufficient documentation

## 2017-06-15 DIAGNOSIS — H02886 Meibomian gland dysfunction of left eye, unspecified eyelid: Secondary | ICD-10-CM | POA: Insufficient documentation

## 2017-08-17 DIAGNOSIS — Z1231 Encounter for screening mammogram for malignant neoplasm of breast: Secondary | ICD-10-CM | POA: Diagnosis not present

## 2017-11-16 DIAGNOSIS — I10 Essential (primary) hypertension: Secondary | ICD-10-CM | POA: Diagnosis not present

## 2017-11-16 DIAGNOSIS — F411 Generalized anxiety disorder: Secondary | ICD-10-CM | POA: Diagnosis not present

## 2017-11-16 DIAGNOSIS — Z0000 Encounter for general adult medical examination without abnormal findings: Secondary | ICD-10-CM | POA: Diagnosis not present

## 2017-11-16 DIAGNOSIS — J302 Other seasonal allergic rhinitis: Secondary | ICD-10-CM | POA: Diagnosis not present

## 2017-11-16 DIAGNOSIS — E7800 Pure hypercholesterolemia, unspecified: Secondary | ICD-10-CM | POA: Diagnosis not present

## 2018-01-07 DIAGNOSIS — M79604 Pain in right leg: Secondary | ICD-10-CM | POA: Diagnosis not present

## 2018-05-22 DIAGNOSIS — I10 Essential (primary) hypertension: Secondary | ICD-10-CM | POA: Diagnosis not present

## 2018-05-22 DIAGNOSIS — F411 Generalized anxiety disorder: Secondary | ICD-10-CM | POA: Diagnosis not present

## 2018-05-22 DIAGNOSIS — Z23 Encounter for immunization: Secondary | ICD-10-CM | POA: Diagnosis not present

## 2018-05-22 DIAGNOSIS — E7800 Pure hypercholesterolemia, unspecified: Secondary | ICD-10-CM | POA: Diagnosis not present

## 2018-08-19 DIAGNOSIS — Z1231 Encounter for screening mammogram for malignant neoplasm of breast: Secondary | ICD-10-CM | POA: Diagnosis not present

## 2018-08-20 ENCOUNTER — Encounter (HOSPITAL_BASED_OUTPATIENT_CLINIC_OR_DEPARTMENT_OTHER): Payer: Self-pay

## 2018-08-20 ENCOUNTER — Other Ambulatory Visit: Payer: Self-pay

## 2018-08-20 ENCOUNTER — Emergency Department (HOSPITAL_BASED_OUTPATIENT_CLINIC_OR_DEPARTMENT_OTHER): Payer: No Typology Code available for payment source

## 2018-08-20 ENCOUNTER — Emergency Department (HOSPITAL_BASED_OUTPATIENT_CLINIC_OR_DEPARTMENT_OTHER)
Admission: EM | Admit: 2018-08-20 | Discharge: 2018-08-20 | Disposition: A | Discharge status: Home / Self Care | Payer: No Typology Code available for payment source | Attending: Emergency Medicine | Admitting: Emergency Medicine

## 2018-08-20 DIAGNOSIS — I10 Essential (primary) hypertension: Secondary | ICD-10-CM | POA: Diagnosis not present

## 2018-08-20 DIAGNOSIS — M7918 Myalgia, other site: Secondary | ICD-10-CM | POA: Diagnosis not present

## 2018-08-20 DIAGNOSIS — Z79899 Other long term (current) drug therapy: Secondary | ICD-10-CM | POA: Diagnosis not present

## 2018-08-20 DIAGNOSIS — M79601 Pain in right arm: Secondary | ICD-10-CM | POA: Diagnosis not present

## 2018-08-20 DIAGNOSIS — R079 Chest pain, unspecified: Secondary | ICD-10-CM | POA: Diagnosis not present

## 2018-08-20 DIAGNOSIS — M25519 Pain in unspecified shoulder: Secondary | ICD-10-CM | POA: Diagnosis not present

## 2018-08-20 DIAGNOSIS — R51 Headache: Secondary | ICD-10-CM | POA: Diagnosis not present

## 2018-08-20 NOTE — Discharge Instructions (Addendum)
Warm compresses to sore muscles for about 20 minutes at a time. Tylenol as needed as directed for pain. Follow-up with your primary care provider.  Return to ER for new or worsening symptoms.
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
## 2018-08-28 DIAGNOSIS — Z789 Other specified health status: Secondary | ICD-10-CM | POA: Diagnosis not present

## 2018-08-28 DIAGNOSIS — S0083XA Contusion of other part of head, initial encounter: Secondary | ICD-10-CM | POA: Diagnosis not present

## 2018-11-08 DIAGNOSIS — H1045 Other chronic allergic conjunctivitis: Secondary | ICD-10-CM | POA: Diagnosis not present

## 2018-12-03 DIAGNOSIS — I10 Essential (primary) hypertension: Secondary | ICD-10-CM | POA: Diagnosis not present

## 2018-12-03 DIAGNOSIS — Z0000 Encounter for general adult medical examination without abnormal findings: Secondary | ICD-10-CM | POA: Diagnosis not present

## 2018-12-03 DIAGNOSIS — E7800 Pure hypercholesterolemia, unspecified: Secondary | ICD-10-CM | POA: Diagnosis not present

## 2018-12-03 DIAGNOSIS — F411 Generalized anxiety disorder: Secondary | ICD-10-CM | POA: Diagnosis not present

## 2019-06-09 DIAGNOSIS — R6881 Early satiety: Secondary | ICD-10-CM | POA: Diagnosis not present

## 2019-06-09 DIAGNOSIS — K219 Gastro-esophageal reflux disease without esophagitis: Secondary | ICD-10-CM | POA: Diagnosis not present

## 2019-06-09 DIAGNOSIS — R140 Abdominal distension (gaseous): Secondary | ICD-10-CM | POA: Diagnosis not present

## 2019-06-09 DIAGNOSIS — K3189 Other diseases of stomach and duodenum: Secondary | ICD-10-CM | POA: Diagnosis not present

## 2019-08-12 DIAGNOSIS — K2930 Chronic superficial gastritis without bleeding: Secondary | ICD-10-CM | POA: Diagnosis not present

## 2019-08-12 DIAGNOSIS — K3189 Other diseases of stomach and duodenum: Secondary | ICD-10-CM | POA: Diagnosis not present

## 2019-08-25 DIAGNOSIS — Z1231 Encounter for screening mammogram for malignant neoplasm of breast: Secondary | ICD-10-CM | POA: Diagnosis not present

## 2019-11-29 ENCOUNTER — Emergency Department (HOSPITAL_COMMUNITY): Payer: Medicare Other

## 2019-11-29 ENCOUNTER — Encounter (HOSPITAL_COMMUNITY): Payer: Self-pay | Admitting: *Deleted

## 2019-11-29 ENCOUNTER — Other Ambulatory Visit: Payer: Self-pay

## 2019-11-29 ENCOUNTER — Emergency Department (HOSPITAL_COMMUNITY)
Admission: EM | Admit: 2019-11-29 | Discharge: 2019-11-30 | Disposition: A | Discharge status: Home / Self Care | Payer: Medicare Other | Attending: Emergency Medicine | Admitting: Emergency Medicine

## 2019-11-29 DIAGNOSIS — M25511 Pain in right shoulder: Secondary | ICD-10-CM | POA: Insufficient documentation

## 2019-11-29 DIAGNOSIS — I10 Essential (primary) hypertension: Secondary | ICD-10-CM | POA: Insufficient documentation

## 2019-11-29 DIAGNOSIS — M25512 Pain in left shoulder: Secondary | ICD-10-CM | POA: Diagnosis not present

## 2019-11-29 DIAGNOSIS — M542 Cervicalgia: Secondary | ICD-10-CM | POA: Diagnosis not present

## 2019-11-29 DIAGNOSIS — R519 Headache, unspecified: Secondary | ICD-10-CM | POA: Diagnosis not present

## 2019-11-29 DIAGNOSIS — Z79899 Other long term (current) drug therapy: Secondary | ICD-10-CM | POA: Diagnosis not present

## 2019-11-29 DIAGNOSIS — M62838 Other muscle spasm: Secondary | ICD-10-CM | POA: Diagnosis not present

## 2019-11-29 LAB — TROPONIN I (HIGH SENSITIVITY): Troponin I (High Sensitivity): 3 ng/L (ref <18)

## 2019-11-29 LAB — CBC
HCT: 42.7 % (ref 36.0–46.0)
Hemoglobin: 14.2 g/dL (ref 12.0–15.0)
MCH: 29.7 pg (ref 26.0–34.0)
MCHC: 33.3 g/dL (ref 30.0–36.0)
MCV: 89.3 fL (ref 80.0–100.0)
Platelets: 203 10*3/uL (ref 150–400)
RBC: 4.78 MIL/uL (ref 3.87–5.11)
RDW: 12 % (ref 11.5–15.5)
WBC: 7.7 10*3/uL (ref 4.0–10.5)
nRBC: 0 % (ref 0.0–0.2)

## 2019-11-29 LAB — COMPREHENSIVE METABOLIC PANEL
ALT: 23 U/L (ref 0–44)
AST: 24 U/L (ref 15–41)
Albumin: 4.2 g/dL (ref 3.5–5.0)
Alkaline Phosphatase: 60 U/L (ref 38–126)
Anion gap: 7 (ref 5–15)
BUN: 27 mg/dL — ABNORMAL HIGH (ref 8–23)
CO2: 27 mmol/L (ref 22–32)
Calcium: 10.1 mg/dL (ref 8.9–10.3)
Chloride: 105 mmol/L (ref 98–111)
Creatinine, Ser: 1.22 mg/dL — ABNORMAL HIGH (ref 0.44–1.00)
GFR calc Af Amer: 52 mL/min — ABNORMAL LOW (ref >60)
GFR calc non Af Amer: 45 mL/min — ABNORMAL LOW (ref >60)
Glucose, Bld: 111 mg/dL — ABNORMAL HIGH (ref 70–99)
Potassium: 4 mmol/L (ref 3.5–5.1)
Sodium: 139 mmol/L (ref 135–145)
Total Bilirubin: 1.4 mg/dL — ABNORMAL HIGH (ref 0.3–1.2)
Total Protein: 7.7 g/dL (ref 6.5–8.1)

## 2019-11-29 LAB — URINALYSIS, ROUTINE W REFLEX MICROSCOPIC
Bacteria, UA: NONE SEEN
Bilirubin Urine: NEGATIVE
Glucose, UA: NEGATIVE mg/dL
Hgb urine dipstick: NEGATIVE
Ketones, ur: NEGATIVE mg/dL
Nitrite: NEGATIVE
Protein, ur: NEGATIVE mg/dL
Specific Gravity, Urine: 1.003 — ABNORMAL LOW (ref 1.005–1.030)
pH: 6 (ref 5.0–8.0)

## 2019-11-29 MED ORDER — METHOCARBAMOL 500 MG PO TABS: 500.0000 mg | ORAL_TABLET | Freq: Three times a day (TID) | ORAL | 0 refills | Status: DC | PRN

## 2019-11-29 MED ORDER — ACETAMINOPHEN 325 MG PO TABS
650.0000 mg | ORAL_TABLET | Freq: Once | ORAL | Status: AC
  Administered 2019-11-29: 650 mg via ORAL
  Filled 2019-11-29: qty 2

## 2019-11-29 NOTE — ED Provider Notes (Signed)
[facility] DEPT Provider Note   CSN: [id number] Arrival date & time: 11/29/19  1801     History Chief Complaint  Patient presents with  . Neck Pain  . Back Pain  . Fatigue    Nonnie Done Sera Hitsman is a 69 y.o. female.  Patient w hx htn, c/o pain to head, and lateral neck, bilateral trapezius/shoulder area for past 1-2 days. Symptoms gradual onset, mild at onset, slowly worse today, dull, diffuse. Denies head injury. No syncope. Denies radicular pain down arms. No numbness or weakness. No change in speech or vision. No anterior/chest pain. No sob. No cough or uri symptoms. No fevers.  The history is provided by the patient. A language interpreter was used.  Neck Pain Associated symptoms: headaches   Associated symptoms: no chest pain, no fever, no numbness and no weakness   Back Pain Associated symptoms: headaches   Associated symptoms: no abdominal pain, no chest pain, no fever, no numbness and no weakness        Past Medical History:  Diagnosis Date  . Hypertension     There are no problems to display for this patient.   Past Surgical History:  Procedure Laterality Date  . BILATERAL OOPHORECTOMY    . KIDNEY SURGERY     right kidney removal.      OB History   No obstetric history on file.     No family history on file.  Social History   Tobacco Use  . Smoking status: Never Smoker  . Smokeless tobacco: Never Used  Substance Use Topics  . Alcohol use: No  . Drug use: No    Home Medications Prior to Admission medications   Medication Sig Start Date End Date Taking? Authorizing Provider  Calcium Carbonate-Vitamin D (CALCIUM 600+D PO) Take 1 tablet by mouth daily.    [provider]  citalopram (CELEXA) 20 MG tablet Take 20 mg by mouth daily.      [provider]  doxazosin (CARDURA) 1 MG tablet Take 2 mg by mouth daily.     [provider]  hydrOXYzine (ATARAX) 25 MG tablet Take 25 mg by mouth 3  (three) times daily as needed.      [provider]  ketoprofen (ORUDIS) 75 MG capsule Take 75 mg by mouth daily as needed for mild pain.  08/15/13   [provider]  Lansoprazole (PREVACID PO) Take 1 capsule by mouth daily.    [provider]  metoprolol tartrate (LOPRESSOR) 25 MG tablet Take 25 mg by mouth 2 (two) times daily. 10/27/13   [provider]  naproxen (NAPROSYN) 500 MG tablet Take 1 tablet (500 mg total) by mouth 2 (two) times daily. 11/11/13   Nat Christen, MD  Omega-3 Fatty Acids (FISH OIL) 1000 MG CAPS Take 1,000 mg by mouth daily.    [provider]  pravastatin (PRAVACHOL) 40 MG tablet Take 40 mg by mouth daily.      [provider]    Allergies    Patient has no active allergies.  Review of Systems   Review of Systems  Constitutional: Negative for fever.  HENT: Negative for sinus pain and sore throat.   Eyes: Negative for pain, redness and visual disturbance.  Respiratory: Negative for cough and shortness of breath.   Cardiovascular: Negative for chest pain.  Gastrointestinal: Negative for abdominal pain and vomiting.  Genitourinary: Negative for flank pain.  Musculoskeletal: Positive for neck pain.  Skin: Negative for rash.  Neurological: Positive for headaches. Negative for speech difficulty, weakness and numbness.  Hematological: Does not bruise/bleed easily.  Psychiatric/Behavioral: Negative for confusion.    Physical Exam Updated Vital Signs BP (!) 147/83 (BP Location: Left Arm)   Pulse 68   Temp 98.1 F (36.7 C) (Oral)   Resp 15   SpO2 97%   Physical Exam Vitals and nursing note reviewed.  Constitutional:      Appearance: Normal appearance. She is well-developed.  HENT:     Head: Atraumatic.     Comments: No sinus or temporal tenderness.     Nose: Nose normal.     Mouth/Throat:     Mouth: Mucous membranes are moist.     Pharynx: Oropharynx is clear.  Eyes:     General: No scleral icterus.     Conjunctiva/sclera: Conjunctivae normal.     Pupils: Pupils are equal, round, and reactive to light.  Neck:     Trachea: No tracheal deviation.     Comments: No stiffness or rigidity.  Cardiovascular:     Rate and Rhythm: Normal rate and regular rhythm.     Pulses: Normal pulses.     Heart sounds: Normal heart sounds. No murmur. No friction rub. No gallop.   Pulmonary:     Effort: Pulmonary effort is normal. No respiratory distress.     Breath sounds: Normal breath sounds.  Abdominal:     General: Bowel sounds are normal. There is no distension.     Palpations: Abdomen is soft.     Tenderness: There is no abdominal tenderness. There is no guarding.  Genitourinary:    Comments: No cva tenderness.  Musculoskeletal:        General: No swelling.     Cervical back: Normal range of motion and neck supple. No rigidity. No muscular tenderness.     Right lower leg: No edema.     Left lower leg: No edema.     Comments: C/T spine non tender, aligned. bil trapezius muscular tenderness. No skin changes or sts.   Skin:    General: Skin is warm and dry.     Findings: No rash.  Neurological:     Mental Status: She is alert.     Comments: Alert, speech normal/fluent. Motor intact bil, stre 5/5. Sens grossly intact bil. Steady gait.   Psychiatric:        Mood and Affect: Mood normal.     ED Results / Procedures / Treatments   Labs (all labs ordered are listed, but only abnormal results are displayed) Results for orders placed or performed during the hospital encounter of 11/29/19  CBC  Result Value Ref Range   WBC 7.7 4.0 - 10.5 K/uL   RBC 4.78 3.87 - 5.11 MIL/uL   Hemoglobin 14.2 12.0 - 15.0 g/dL   HCT 84.6 96.2 - 95.2 %   MCV 89.3 80.0 - 100.0 fL   MCH 29.7 26.0 - 34.0 pg   MCHC 33.3 30.0 - 36.0 g/dL   RDW 84.1 32.4 - 40.1 %   Platelets 203 150 - 400 K/uL   nRBC 0.0 0.0 - 0.2 %  Comprehensive metabolic panel  Result Value Ref Range   Sodium 139 135 - 145 mmol/L   Potassium 4.0  3.5 - 5.1 mmol/L   Chloride 105 98 - 111 mmol/L   CO2 27 22 - 32 mmol/L   Glucose, Bld 111 (H) 70 - 99 mg/dL   BUN 27 (H) 8 - 23 mg/dL   Creatinine,  Ser 1.22 (H) 0.44 - 1.00 mg/dL   Calcium 24.0 8.9 - 97.3 mg/dL   Total Protein 7.7 6.5 - 8.1 g/dL   Albumin 4.2 3.5 - 5.0 g/dL   AST 24 15 - 41 U/L   ALT 23 0 - 44 U/L   Alkaline Phosphatase 60 38 - 126 U/L   Total Bilirubin 1.4 (H) 0.3 - 1.2 mg/dL   GFR calc non Af Amer 45 (L) >60 mL/min   GFR calc Af Amer 52 (L) >60 mL/min   Anion gap 7 5 - 15  Urinalysis, Routine w reflex microscopic  Result Value Ref Range   Color, Urine STRAW (A) YELLOW   APPearance CLEAR CLEAR   Specific Gravity, Urine 1.003 (L) 1.005 - 1.030   pH 6.0 5.0 - 8.0   Glucose, UA NEGATIVE NEGATIVE mg/dL   Hgb urine dipstick NEGATIVE NEGATIVE   Bilirubin Urine NEGATIVE NEGATIVE   Ketones, ur NEGATIVE NEGATIVE mg/dL   Protein, ur NEGATIVE NEGATIVE mg/dL   Nitrite NEGATIVE NEGATIVE   Leukocytes,Ua TRACE (A) NEGATIVE   WBC, UA 0-5 0 - 5 WBC/hpf   Bacteria, UA NONE SEEN NONE SEEN  Troponin I (High Sensitivity)  Result Value Ref Range   Troponin I (High Sensitivity) 3 <18 ng/L    EKG EKG Interpretation  Date/Time:  Saturday November 29 2019 21:48:14 EDT Ventricular Rate:  70 PR Interval:    QRS Duration: 92 QT Interval:  419 QTC Calculation: 453 R Axis:   19 Text Interpretation: Sinus rhythm No significant change since last tracing Confirmed by Cathren Laine ([id number]) on 11/29/2019 9:58:47 PM   Radiology CT HEAD WO CONTRAST  Result Date: 11/29/2019 CLINICAL DATA:  Back pain headache high blood pressure EXAM: CT HEAD WITHOUT CONTRAST TECHNIQUE: Contiguous axial images were obtained from the base of the skull through the vertex without intravenous contrast. COMPARISON:  MRI 10/06/2005, CT brain 10/02/2005 FINDINGS: Brain: No acute territorial infarction, hemorrhage or intracranial mass. The ventricles are nonenlarged. Prominent mineralization in the basal ganglia  as before Vascular: No hyperdense vessels.  Carotid vascular calcification Skull: No fracture or suspicious lesion. Chronic deformity at the left mandibular head. Sinuses/Orbits: No acute finding. Other: None IMPRESSION: Negative non contrasted CT appearance of the brain. Electronically Signed   By: Jasmine Pang M.D.   On: 11/29/2019 22:53    Procedures Procedures (including critical care time)  Medications Ordered in ED Medications  acetaminophen (TYLENOL) tablet 650 mg (has no administration in time range)    ED Course  I have reviewed the triage vital signs and the nursing notes.  Pertinent labs & imaging results that were available during my care of the patient were reviewed by me and considered in my medical decision making (see chart for details).    MDM Rules/Calculators/A&P                      Labs and imaging ordered. Continuous pulse ox and monitor. Ecg.   Reviewed nursing notes and prior charts for additional history.   Acetaminophen po.   Labs reviewed/interpreted by me - chem normal. After constant symptoms for past 1-2 days, trop normal - felt not c/w ACS.   CT reviewed/interpreted by me - no hem.   Recheck pt - comfortable, tolerating po, no distress.  MDM Number of Diagnoses or Management Options   Amount and/or Complexity of Data Reviewed Clinical lab tests: ordered and reviewed Tests in the radiology section of CPT: ordered and reviewed Tests in the  medicine section of CPT: ordered and reviewed Decide to obtain previous medical records or to obtain history from someone other than the patient: yes Review and summarize past medical records: yes  Risk of Complications, Morbidity, and/or Mortality Presenting problems: high Diagnostic procedures: high Management options: high  patient initial presentation with headache, as well as neck/shoulder/upper chest area pain. Differential dx is v broad, including SAH, migraine, tension headache, ACS, MSK pain,  DDD - many of these conditions carry significant morbidity.   After eval in ED, symptoms improved, patient comfortable. No increased wob.   Patient currently appears stable for d/c.   Rec pcp f/u.  Return precautions provided.       Final Clinical Impression(s) / ED Diagnoses Final diagnoses:  None    Rx / DC Orders ED Discharge Orders    None       Cathren Laine, MD 11/29/19 2311

## 2019-11-29 NOTE — Discharge Instructions (Addendum)
It was our pleasure to provide your ER care today - we hope that you feel better.  You may take ibuprofen or acetaminophen as need for pain.   You may also try robaxin as need for muscle pain/spasm - no driving when taking.  Follow up with primary care doctor in the next 1-2 weeks.   Return to ER if worse, new symptoms, fevers, severe headache, chest pain, trouble breathing, or other concern.

## 2019-11-29 NOTE — ED Notes (Signed)
Pt daughter called [phone]  [facility]

## 2019-11-29 NOTE — ED Triage Notes (Signed)
Pt complains of pain in her back and neck. She reports her SBP was high, 180 at home. She has been taking her blood pressure medication. She also reports fatigue for the past 2 weeks. Pt speaks [place] Nurse, learning disability used.

## 2019-11-30 NOTE — ED Notes (Signed)
Called daughter for discharge and ride home. Daughter said she will be at [facility] in 30 mins  [facility], told to call when up front.

## 2019-12-04 ENCOUNTER — Ambulatory Visit: Payer: Medicare Other | Attending: Internal Medicine

## 2019-12-04 DIAGNOSIS — Z23 Encounter for immunization: Secondary | ICD-10-CM

## 2019-12-04 NOTE — Progress Notes (Signed)
   Covid-19 Vaccination Clinic  Name:  Debbie Stevens    MRN: [id number] DOB: 05/20/1951  12/04/2019  [name] was observed post Covid-19 immunization for 15 minutes without incident. She was provided with Vaccine Information Sheet and instruction to access the V-Safe system.   [name] was instructed to call 911 with any severe reactions post vaccine: Marland Kitchen Difficulty breathing  . Swelling of face and throat  . A fast heartbeat  . A bad rash all over body  . Dizziness and weakness   Immunizations Administered    Name Date Dose VIS Date Route   Pfizer COVID-19 Vaccine 12/04/2019  8:22 AM 0.3 mL 08/01/2019 Intramuscular   Manufacturer: [employer], [employer]   Lot: [id number]   NDC: [address]-5

## 2019-12-09 DIAGNOSIS — F411 Generalized anxiety disorder: Secondary | ICD-10-CM | POA: Diagnosis not present

## 2019-12-09 DIAGNOSIS — Z789 Other specified health status: Secondary | ICD-10-CM | POA: Diagnosis not present

## 2019-12-09 DIAGNOSIS — K219 Gastro-esophageal reflux disease without esophagitis: Secondary | ICD-10-CM | POA: Diagnosis not present

## 2019-12-09 DIAGNOSIS — Z0000 Encounter for general adult medical examination without abnormal findings: Secondary | ICD-10-CM | POA: Diagnosis not present

## 2019-12-09 DIAGNOSIS — R519 Headache, unspecified: Secondary | ICD-10-CM | POA: Diagnosis not present

## 2019-12-09 DIAGNOSIS — I10 Essential (primary) hypertension: Secondary | ICD-10-CM | POA: Diagnosis not present

## 2019-12-09 DIAGNOSIS — E7800 Pure hypercholesterolemia, unspecified: Secondary | ICD-10-CM | POA: Diagnosis not present

## 2019-12-13 ENCOUNTER — Other Ambulatory Visit: Payer: Self-pay

## 2019-12-13 ENCOUNTER — Emergency Department (HOSPITAL_BASED_OUTPATIENT_CLINIC_OR_DEPARTMENT_OTHER)
Admission: EM | Admit: 2019-12-13 | Discharge: 2019-12-13 | Disposition: A | Discharge status: Home / Self Care | Payer: Medicare Other | Attending: Emergency Medicine | Admitting: Emergency Medicine

## 2019-12-13 ENCOUNTER — Encounter (HOSPITAL_BASED_OUTPATIENT_CLINIC_OR_DEPARTMENT_OTHER): Payer: Self-pay

## 2019-12-13 ENCOUNTER — Emergency Department (HOSPITAL_BASED_OUTPATIENT_CLINIC_OR_DEPARTMENT_OTHER): Payer: Medicare Other

## 2019-12-13 DIAGNOSIS — Z905 Acquired absence of kidney: Secondary | ICD-10-CM | POA: Diagnosis not present

## 2019-12-13 DIAGNOSIS — I10 Essential (primary) hypertension: Secondary | ICD-10-CM | POA: Insufficient documentation

## 2019-12-13 DIAGNOSIS — R944 Abnormal results of kidney function studies: Secondary | ICD-10-CM | POA: Insufficient documentation

## 2019-12-13 DIAGNOSIS — Z888 Allergy status to other drugs, medicaments and biological substances status: Secondary | ICD-10-CM | POA: Insufficient documentation

## 2019-12-13 DIAGNOSIS — R1013 Epigastric pain: Secondary | ICD-10-CM

## 2019-12-13 DIAGNOSIS — R1110 Vomiting, unspecified: Secondary | ICD-10-CM | POA: Diagnosis not present

## 2019-12-13 DIAGNOSIS — Z79899 Other long term (current) drug therapy: Secondary | ICD-10-CM | POA: Insufficient documentation

## 2019-12-13 DIAGNOSIS — R7989 Other specified abnormal findings of blood chemistry: Secondary | ICD-10-CM | POA: Diagnosis not present

## 2019-12-13 HISTORY — DX: Gastro-esophageal reflux disease without esophagitis: K21.9

## 2019-12-13 LAB — COMPREHENSIVE METABOLIC PANEL
ALT: 22 U/L (ref 0–44)
AST: 24 U/L (ref 15–41)
Albumin: 4.4 g/dL (ref 3.5–5.0)
Alkaline Phosphatase: 58 U/L (ref 38–126)
Anion gap: 11 (ref 5–15)
BUN: 20 mg/dL (ref 8–23)
CO2: 24 mmol/L (ref 22–32)
Calcium: 9.9 mg/dL (ref 8.9–10.3)
Chloride: 98 mmol/L (ref 98–111)
Creatinine, Ser: 1.25 mg/dL — ABNORMAL HIGH (ref 0.44–1.00)
GFR calc Af Amer: 50 mL/min — ABNORMAL LOW (ref >60)
GFR calc non Af Amer: 44 mL/min — ABNORMAL LOW (ref >60)
Glucose, Bld: 103 mg/dL — ABNORMAL HIGH (ref 70–99)
Potassium: 3.6 mmol/L (ref 3.5–5.1)
Sodium: 133 mmol/L — ABNORMAL LOW (ref 135–145)
Total Bilirubin: 1 mg/dL (ref 0.3–1.2)
Total Protein: 7.7 g/dL (ref 6.5–8.1)

## 2019-12-13 LAB — CBC
HCT: 42.2 % (ref 36.0–46.0)
Hemoglobin: 14 g/dL (ref 12.0–15.0)
MCH: 28.6 pg (ref 26.0–34.0)
MCHC: 33.2 g/dL (ref 30.0–36.0)
MCV: 86.3 fL (ref 80.0–100.0)
Platelets: 218 10*3/uL (ref 150–400)
RBC: 4.89 MIL/uL (ref 3.87–5.11)
RDW: 12.1 % (ref 11.5–15.5)
WBC: 6.2 10*3/uL (ref 4.0–10.5)
nRBC: 0 % (ref 0.0–0.2)

## 2019-12-13 LAB — URINALYSIS, ROUTINE W REFLEX MICROSCOPIC
Bilirubin Urine: NEGATIVE
Glucose, UA: NEGATIVE mg/dL
Ketones, ur: NEGATIVE mg/dL
Nitrite: NEGATIVE
Protein, ur: NEGATIVE mg/dL
Specific Gravity, Urine: <1.005 — ABNORMAL LOW (ref 1.005–1.030)
pH: 6 (ref 5.0–8.0)

## 2019-12-13 LAB — URINALYSIS, MICROSCOPIC (REFLEX)

## 2019-12-13 LAB — LIPASE, BLOOD: Lipase: 58 U/L — ABNORMAL HIGH (ref 11–51)

## 2019-12-13 LAB — CBG MONITORING, ED: Glucose-Capillary: 96 mg/dL (ref 70–99)

## 2019-12-13 MED ORDER — SUCRALFATE 1 G PO TABS: 1.0000 g | ORAL_TABLET | Freq: Three times a day (TID) | ORAL | 0 refills | Status: AC

## 2019-12-13 MED ORDER — ALUM & MAG HYDROXIDE-SIMETH 200-200-20 MG/5ML PO SUSP
30.0000 mL | Freq: Once | ORAL | Status: AC
  Administered 2019-12-13: 30 mL via ORAL
  Filled 2019-12-13: qty 30

## 2019-12-13 MED ORDER — IOHEXOL 300 MG/ML  SOLN
100.0000 mL | Freq: Once | INTRAMUSCULAR | Status: AC | PRN
  Administered 2019-12-13: 80 mL via INTRAVENOUS

## 2019-12-13 MED ORDER — SODIUM CHLORIDE 0.9 % IV BOLUS
1000.0000 mL | Freq: Once | INTRAVENOUS | Status: AC
  Administered 2019-12-13: 17:00:00 1000 mL via INTRAVENOUS

## 2019-12-13 MED ORDER — LIDOCAINE VISCOUS HCL 2 % MT SOLN
15.0000 mL | Freq: Once | OROMUCOSAL | Status: AC
  Administered 2019-12-13: 15 mL via ORAL
  Filled 2019-12-13: qty 15

## 2019-12-13 NOTE — ED Provider Notes (Signed)
[facility] EMERGENCY DEPARTMENT Provider Note   CSN: [id number] Arrival date & time: 12/13/19  1522     History Chief Complaint  Patient presents with  . Abdominal Pain    Debbie Stevens Debbie Stevens is a 69 y.o. female.  Patient is a 69 year old female who presents with epigastric pain.  History is obtained through her daughter who interprets.  I did attempt to get the video language line interpreter but the patient and the family member said that they wanted her the patient's daughter to interpret.  Patient states that she has had discomfort in her epigastrium which she describes as a burning pain for the last few months.  She has seen a gastroenterologist with [facility].  She has tried a couple different medicines.  Most recently she started Nexium but only started yesterday.  Over the last 3 weeks she has had worsening pain in her epigastrium and has severe burning anytime she tries to eat.  She feels like food gets stuck there.  She does not have any change in bowels although she has not had a bowel movement recently but she is only been drinking broth and not really able to eat any foods.  She denies any reflux symptoms.  No nausea or vomiting.  She feels hot but no documented fevers.  No urinary symptoms.        Past Medical History:  Diagnosis Date  . GERD (gastroesophageal reflux disease)   . Hypertension     There are no problems to display for this patient.   Past Surgical History:  Procedure Laterality Date  . BILATERAL OOPHORECTOMY    . KIDNEY SURGERY     right kidney removal.      OB History   No obstetric history on file.     No family history on file.  Social History   Tobacco Use  . Smoking status: Never Smoker  . Smokeless tobacco: Never Used  Substance Use Topics  . Alcohol use: No  . Drug use: No    Home Medications Prior to Admission medications   Medication Sig Start Date End Date Taking? Authorizing Provider  Calcium Carbonate-Vitamin D  (CALCIUM 600+D PO) Take 1 tablet by mouth daily.    [provider]  citalopram (CELEXA) 20 MG tablet Take 20 mg by mouth daily.      [provider]  doxazosin (CARDURA) 1 MG tablet Take 2 mg by mouth daily.     [provider]  hydrOXYzine (ATARAX) 25 MG tablet Take 25 mg by mouth 3 (three) times daily as needed.      [provider]  ketoprofen (ORUDIS) 75 MG capsule Take 75 mg by mouth daily as needed for mild pain.  08/15/13   [provider]  Lansoprazole (PREVACID PO) Take 1 capsule by mouth daily.    [provider]  methocarbamol (ROBAXIN) 500 MG tablet Take 1 tablet (500 mg total) by mouth every 8 (eight) hours as needed (muscle spasm/pain). 11/29/19   Lajean Saver, MD  metoprolol tartrate (LOPRESSOR) 25 MG tablet Take 25 mg by mouth 2 (two) times daily. 10/27/13   [provider]  naproxen (NAPROSYN) 500 MG tablet Take 1 tablet (500 mg total) by mouth 2 (two) times daily. 11/11/13   Nat Christen, MD  Omega-3 Fatty Acids (FISH OIL) 1000 MG CAPS Take 1,000 mg by mouth daily.    [provider]  pravastatin (PRAVACHOL) 40 MG tablet Take 40 mg by mouth daily.  [provider]  sucralfate (CARAFATE) 1 g tablet Take 1 tablet (1 g total) by mouth 4 (four) times daily -  with meals and at bedtime. 12/13/19   Rolan Bucco, MD    Allergies    Amlodipine and Metoprolol  Review of Systems   Review of Systems  Constitutional: Positive for appetite change. Negative for chills, diaphoresis, fatigue and fever.  HENT: Negative for congestion, rhinorrhea and sneezing.   Eyes: Negative.   Respiratory: Negative for cough, chest tightness and shortness of breath.   Cardiovascular: Negative for chest pain and leg swelling.  Gastrointestinal: Positive for abdominal pain. Negative for blood in stool, diarrhea, nausea and vomiting.  Genitourinary: Negative for difficulty urinating, flank pain, frequency and hematuria.   Musculoskeletal: Negative for arthralgias and back pain.  Skin: Negative for rash.  Neurological: Negative for dizziness, speech difficulty, weakness, numbness and headaches.    Physical Exam Updated Vital Signs BP (!) 150/79 (BP Location: Left Arm)   Pulse 81   Temp 98.4 F (36.9 C) (Oral)   Resp 16   Ht 5' (1.524 m)   Wt 59.4 kg   SpO2 98%   BMI 25.58 kg/m   Physical Exam Constitutional:      Appearance: She is well-developed.  HENT:     Head: Normocephalic and atraumatic.  Eyes:     Pupils: Pupils are equal, round, and reactive to light.  Cardiovascular:     Rate and Rhythm: Normal rate and regular rhythm.     Heart sounds: Normal heart sounds.  Pulmonary:     Effort: Pulmonary effort is normal. No respiratory distress.     Breath sounds: Normal breath sounds. No wheezing or rales.  Chest:     Chest wall: No tenderness.  Abdominal:     General: Bowel sounds are normal.     Palpations: Abdomen is soft.     Tenderness: There is abdominal tenderness in the epigastric area. There is no guarding or rebound.  Musculoskeletal:        General: Normal range of motion.     Cervical back: Normal range of motion and neck supple.  Lymphadenopathy:     Cervical: No cervical adenopathy.  Skin:    General: Skin is warm and dry.     Findings: No rash.  Neurological:     Mental Status: She is alert and oriented to person, place, and time.     ED Results / Procedures / Treatments   Labs (all labs ordered are listed, but only abnormal results are displayed) Labs Reviewed  LIPASE, BLOOD - Abnormal; Notable for the following components:      Result Value   Lipase 58 (*)    All other components within normal limits  COMPREHENSIVE METABOLIC PANEL - Abnormal; Notable for the following components:   Sodium 133 (*)    Glucose, Bld 103 (*)    Creatinine, Ser 1.25 (*)    GFR calc non Af Amer 44 (*)    GFR calc Af Amer 50 (*)    All other components within normal limits   URINALYSIS, ROUTINE W REFLEX MICROSCOPIC - Abnormal; Notable for the following components:   Color, Urine STRAW (*)    Specific Gravity, Urine <1.005 (*)    Hgb urine dipstick TRACE (*)    Leukocytes,Ua TRACE (*)    All other components within normal limits  URINALYSIS, MICROSCOPIC (REFLEX) - Abnormal; Notable for the following components:   Bacteria, UA RARE (*)    All other  components within normal limits  CBC  CBG MONITORING, ED    EKG None  Radiology CT Abdomen Pelvis W Contrast  Result Date: 12/13/2019 CLINICAL DATA:  69 year old female with acute abdominal and pelvic pain and vomiting. EXAM: CT ABDOMEN AND PELVIS WITH CONTRAST TECHNIQUE: Multidetector CT imaging of the abdomen and pelvis was performed using the standard protocol following bolus administration of intravenous contrast. CONTRAST:  74mL OMNIPAQUE IOHEXOL 300 MG/ML  SOLN COMPARISON:  None. FINDINGS: Lower chest: No acute abnormality. Hepatobiliary: The liver and gallbladder are unremarkable. No biliary dilatation. Pancreas: Unremarkable Spleen: Unremarkable Adrenals/Urinary Tract: Patient is status post RIGHT nephrectomy. LEFT renal cysts again noted. The adrenal glands and bladder are unremarkable. Stomach/Bowel: Stomach is within normal limits. Appendix appears normal. No evidence of bowel wall thickening, distention, or inflammatory changes. Vascular/Lymphatic: Aortic atherosclerosis. No enlarged abdominal or pelvic lymph nodes. Reproductive: Status post hysterectomy. No adnexal masses. Other: No ascites, focal collection/abscess or pneumoperitoneum. Musculoskeletal: No acute or suspicious bony abnormalities. Degenerative changes in the lumbar spine noted. IMPRESSION: No evidence of acute abnormality. Aortic Atherosclerosis (ICD10-I70.0). Electronically Signed   By: Harmon Pier M.D.   On: 12/13/2019 18:03    Procedures Procedures (including critical care time)  Medications Ordered in ED Medications  sodium chloride  0.9 % bolus 1,000 mL (0 mLs Intravenous Stopped 12/13/19 1806)  alum & mag hydroxide-simeth (MAALOX/MYLANTA) 200-200-20 MG/5ML suspension 30 mL (30 mLs Oral Given 12/13/19 1708)    And  lidocaine (XYLOCAINE) 2 % viscous mouth solution 15 mL (15 mLs Oral Given 12/13/19 1708)  iohexol (OMNIPAQUE) 300 MG/ML solution 100 mL (80 mLs Intravenous Contrast Given 12/13/19 1740)    ED Course  I have reviewed the triage vital signs and the nursing notes.  Pertinent labs & imaging results that were available during my care of the patient were reviewed by me and considered in my medical decision making (see chart for details).    MDM Rules/Calculators/A&P                      Patient is a 69 year old female who presents with epigastric pain.  This is been ongoing for several months but worse over the last 3 weeks.  There is no vomiting.  No change in bowels.  No fevers.  Her lipase is mildly elevated.  Her creatinine is mildly elevated as compared to prior values.  LFTs are normal.  CT scan shows no acute abnormalities.  There is no evidence of gallbladder disease or pancreatic mass.  She feels better after the GI cocktail.  I suspect this is likely gastritis or peptic ulcer disease.  She will continue taking the Nexium which was just recently prescribed and she is only had 1 dose so far.  I did add Carafate.  She has an appointment with GI on May 10.  They are trying to get her an earlier appointment.  She was advised to use a nonfat diet.  She was advised to have her primary care physician follow her creatinine.  The daughter says she just had some labs at her PCPs office within the last few days so they are likely aware of the elevation.  It was noted to be elevated 2 weeks ago when she had labs done in the ED as well.  Return precautions were given. Final Clinical Impression(s) / ED Diagnoses Final diagnoses:  Epigastric pain  Elevated serum creatinine    Rx / DC Orders ED Discharge Orders  Ordered    sucralfate (CARAFATE) 1 g tablet  3 times daily with meals & bedtime     12/13/19 1855           Rolan Bucco, MD 12/13/19 1858

## 2019-12-13 NOTE — ED Triage Notes (Signed)
Pt seen at [facility] two weeks ago for abd pain. Pain has persisted and pt feels fatigued today. Pt has been on clear liquids only since ED visit. Pt f/u with PCP and was placed on omeprazole without relief.

## 2019-12-13 NOTE — Discharge Instructions (Addendum)
Follow-up with your gastroenterologist on May 10 as scheduled.  Maintain a nonfat diet.  Continue taking the medication that you were recently prescribed in addition to the Carafate which you were prescribed today.  Your creatinine was mildly elevated and needs to be followed by your primary care doctor.  Return to the emergency room if you have any worsening symptoms.

## 2019-12-16 ENCOUNTER — Other Ambulatory Visit: Payer: Self-pay | Admitting: Gastroenterology

## 2019-12-16 DIAGNOSIS — R1013 Epigastric pain: Secondary | ICD-10-CM

## 2019-12-17 ENCOUNTER — Other Ambulatory Visit: Payer: PRIVATE HEALTH INSURANCE

## 2019-12-22 ENCOUNTER — Ambulatory Visit
Admission: RE | Admit: 2019-12-22 | Discharge: 2019-12-22 | Disposition: A | Discharge status: Home / Self Care | Payer: Medicare Other | Source: Ambulatory Visit | Attending: Gastroenterology | Admitting: Gastroenterology

## 2019-12-22 DIAGNOSIS — R1013 Epigastric pain: Secondary | ICD-10-CM | POA: Diagnosis not present

## 2019-12-25 ENCOUNTER — Ambulatory Visit (INDEPENDENT_AMBULATORY_CARE_PROVIDER_SITE_OTHER): Payer: Medicare Other | Admitting: Neurology

## 2019-12-25 ENCOUNTER — Encounter: Payer: Self-pay | Admitting: Neurology

## 2019-12-25 ENCOUNTER — Other Ambulatory Visit: Payer: Self-pay | Admitting: Neurology

## 2019-12-25 ENCOUNTER — Other Ambulatory Visit: Payer: Self-pay

## 2019-12-25 VITALS — BP 155/86 | HR 64 | Temp 97.0°F | Ht 62.0 in | Wt 128.0 lb

## 2019-12-25 DIAGNOSIS — G44229 Chronic tension-type headache, not intractable: Secondary | ICD-10-CM | POA: Insufficient documentation

## 2019-12-25 DIAGNOSIS — G44221 Chronic tension-type headache, intractable: Secondary | ICD-10-CM | POA: Diagnosis not present

## 2019-12-25 MED ORDER — NABUMETONE 500 MG PO TABS: 500.0000 mg | ORAL_TABLET | ORAL | 1 refills | Status: DC | PRN

## 2019-12-25 MED ORDER — AMITRIPTYLINE HCL 25 MG PO TABS: 25.0000 mg | ORAL_TABLET | Freq: Every day | ORAL | 3 refills | Status: DC

## 2019-12-25 MED ORDER — NABUMETONE 500 MG PO TABS: 500.0000 mg | ORAL_TABLET | Freq: Every day | ORAL | 1 refills | Status: DC | PRN

## 2019-12-25 NOTE — Progress Notes (Signed)
[facility]    Provider:  Melvyn Novas, MD  Primary Care Physician:  Clayborn Heron, MD [address] [place] [address]     Referring Provider: Clayborn Heron, Md [address] [place],  [place] [address]          Chief Complaint according to patient   Patient presents with:    . New Patient (Initial Visit)           HISTORY OF PRESENT ILLNESS:  Debbie Stevens is a 69 y.o. year old [place] female patient seen here with interpreter-as a referral on 12/25/2019  for a headache evaluation.   Chief concern according to patient :   Debbie Stevens has suffered from headache in the crown and back of the head for 20 some years. She feels fine in AM- the headaches progress usually over the afternoon and become more severe towards the evening. There is no associated nausea, no photophobia.  The neck is tense and hurts between the shoulder blades when she hovers over her workspace. She is a Futures trader.    I have the pleasure of seeing Debbie Stevens today, a right-handed Asian female with a chronic headache. She  has a past medical history of GERD (gastroesophageal reflux disease) and Hypertension. She has been seen at [facility] may be 10 years ago. She reports having been prescribed NABUMETONE- No family history of headaches.    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 .  Living with daughter and grandchildren. Pets are present. 2 dogs.  Tobacco use: never .  ETOH use; never ,  Caffeine intake in form of Coffee( used to drink 2 a day.     Review of Systems: Out of a complete 14 system review, the patient complains of only the following symptoms, and all other reviewed systems are negative.:    headaches - tension type, 3 days a week or more.  Non migrainous.   Social History   Socioeconomic History  . Marital status: Divorced    Spouse name:  Not on file  . Number of children: Not on file  . Years of education: Not on file  . Highest education level: Not on file  Occupational History  . Not on file  Tobacco Use  . Smoking status: Never Smoker  . Smokeless tobacco: Never Used  Substance and Sexual Activity  . Alcohol use: No  . Drug use: No  . Sexual activity: Not on file  Other Topics Concern  . Not on file  Social History Narrative  . Not on file   Social Determinants of Health   Financial Resource Strain:   . Difficulty of Paying Living Expenses:   Food Insecurity:   . Worried About Programme researcher, broadcasting/film/video in the Last Year:   . Barista in the Last Year:   Transportation Needs:   . Freight forwarder (Medical):   Marland Kitchen Lack of Transportation (Non-Medical):   Physical Activity:   . Days of Exercise per Week:   . Minutes of Exercise per Session:   Stress:   . Feeling of Stress :   Social Connections:   . Frequency of Communication with Friends and Family:   . Frequency of Social Gatherings with Friends and Family:   . Attends Religious Services:   . Active Member of  Clubs or Organizations:   . Attends Banker Meetings:   Marland Kitchen Marital Status:     Past Medical History:  Diagnosis Date  . GERD (gastroesophageal reflux disease)   . Hypertension     Past Surgical History:  Procedure Laterality Date  . BILATERAL OOPHORECTOMY    . KIDNEY SURGERY     right kidney removal.      Current Outpatient Medications on File Prior to Visit  Medication Sig Dispense Refill  . acetaminophen (TYLENOL 8 HOUR) 650 MG CR tablet Take 650 mg by mouth every 8 (eight) hours as needed for pain.    . Boswellia-Glucosamine-Vit D (GLUCOSAMINE COMPLEX PO) Take 1 tablet by mouth daily. Muscle/joint pain    . citalopram (CELEXA) 40 MG tablet Take 40 mg by mouth daily.     Marland Kitchen doxazosin (CARDURA) 1 MG tablet Take 2 mg by mouth daily.     Marland Kitchen esomeprazole (NEXIUM) 40 MG capsule     . metoprolol tartrate (LOPRESSOR) 25 MG  tablet Take 25 mg by mouth 2 (two) times daily.    . Omega-3 Fatty Acids (FISH OIL) 1000 MG CAPS Take 1,000 mg by mouth daily.    . pravastatin (PRAVACHOL) 40 MG tablet Take 40 mg by mouth daily.      . sucralfate (CARAFATE) 1 g tablet Take 1 tablet (1 g total) by mouth 4 (four) times daily -  with meals and at bedtime. 60 tablet 0  . triamterene-hydrochlorothiazide (MAXZIDE-25) 37.5-25 MG tablet Take 1 tablet by mouth daily.    . hydrOXYzine (ATARAX) 25 MG tablet Take 25 mg by mouth 3 (three) times daily as needed.       No current facility-administered medications on file prior to visit.    Allergies  Allergen Reactions  . Amlodipine Other (See Comments)    Flushing  . Metoprolol Palpitations    Physical exam:  Today's Vitals   12/25/19 1331  BP: (!) 155/86  Pulse: 64  Temp: (!) 97 F (36.1 C)  Weight: 128 lb (58.1 kg)  Height: 5\' 2"  (1.575 m)   Body mass index is 23.41 kg/m.   Wt Readings from Last 3 Encounters:  12/25/19 128 lb (58.1 kg)  12/13/19 131 lb (59.4 kg)  08/20/18 135 lb (61.2 kg)     Ht Readings from Last 3 Encounters:  12/25/19 5\' 2"  (1.575 m)  12/13/19 5' (1.524 m)  08/20/18 5\' 3"  (1.6 m)      General: The patient is awake, alert and appears not in acute distress. The patient is well groomed. Head: Normocephalic, atraumatic. Neck is supple. Mallampati 2  neck circumference:14 inches . Nasal airflow  patent.  Retrognathia is  seen.   Cardiovascular:  Regular rate and cardiac rhythm by pulse,  without distended neck veins. Respiratory: Lungs are clear to auscultation.  Skin:  Without evidence of ankle edema, or rash. Trunk: The patient's posture is erect.   Neurologic exam : The patient is awake and alert, oriented to place and time.   Memory subjective described as intact.  Attention span & concentration ability appears normal.  Speech is fluent,  without  dysarthria, dysphonia or aphasia.  Mood and affect are appropriate.   Cranial nerves:  no loss of smell or taste reported  Pupils are equal and briskly reactive to light. Funduscopic exam deferred.   Extraocular movements in vertical and horizontal planes were intact and without nystagmus. No Diplopia. Visual fields by finger perimetry are intact. Hearing was intact to soft  voice and finger rubbing.    Facial sensation intact to fine touch.  Facial motor strength is symmetric and tongue and uvula move midline.  Neck ROM :  Crackling and pain rotation,  TENSION radiating al the way up to occipital region.  and flexion extension were restricted , the shoulder shrug was not symmetrical.  Right shoulder shrug is lower and ROM restricted.    Motor exam:  Symmetric bulk, tone and ROM.   Normal tone without cog wheeling, symmetric grip strength .   Sensory:  Fine touch, pinprick and vibration were tested  and  normal.  Proprioception tested in the upper extremities was normal. Coordination: Rapid alternating movements in the fingers/hands were of normal speed.  The Finger-to-nose maneuver was intact  without evidence of ataxia, dysmetria or tremor.   Gait and station: Patient could rise unassisted from a seated position, walked without assistive device.  Stance is of normal width/ base and the patient turned with 3 steps.  Toe and heel walk were deferred.  Deep tendon reflexes: in the  upper and lower extremities are symmetric and intact.  Babinski response was deferred .      After spending a total time of  55  minutes face to face including additional time for physical and neurologic examination, review of laboratory studies,( needing interpreter service time) personal review of imaging studies, reports and results of other testing and review of referral information / records as far as provided in visit, I have established the following assessments:  1)  Classic tension headache- cervicalgia, occipital tension all the way to shoulder     My Plan is to proceed with:  at  night / ;ate afternoon muscle relaxants is first step- amitriptyline. Refill of NABUMETONE in daytime PRN.     I would like to thank Rankins, Bill Salinas, MD and Rankins, Bill Salinas, [place] [address] [place],  [place] [address] for allowing me to meet with and to take care of this pleasant patient.   I plan to follow up either personally or through our NP within 3-6 month.   CC: I will share my notes with PCP.   Electronically signed by: Larey Seat, MD 12/25/2019 1:58 PM  [facility] Board certified by The [employer] of Sleep Medicine and Diplomate of the [employer] of Sleep Medicine. Board certified In Neurology through the [place], Fellow of the [employer] of Neurology. Medical Director of [facility].

## 2019-12-25 NOTE — Patient Instructions (Addendum)
Amitriptyline tablets ?y l thu?c g? AMITRIPTYLINE ???c dng ?? ?i?u tr? ch?ng tr?m c?m. Thu?c ny c th? ???c dng cho nh?ng m?c ?ch khc; hy h?i ng??i cung c?p d?ch v? y t? ho?c d??c s? c?a mnh, n?u qu v? c th?c m?c. (CC) NHN HI?U PH? BI?N: Elavil, Vanatrip Ti c?n ph?i bo cho ng??i cung c?p d?ch v? y t? c?a mnh ?i?u g tr??c khi dng thu?c ny? H? c?n bi?t li?u qu v? hi?n c b?t k? tnh tr?ng no sau ?y hay khng:  v?n ?? v? r??u  b?nh hen suy?n, kh th?  r?i lo?n l??ng c?c ho?c tm th?n phn li?t  kh ?i ti?u, nhi?p h? tuy?n c tr?c tr?c  b?nh t?ng nhn p  ti?n s? b? suy tim, nh?i mu c? tim, ho?c b?nh tim khc  b?nh gan  c??ng tuy?n gip tr?ng  co gi?t  c  ngh? ho?c  ??nh t? v?n  tr??c ?y qu v? ho?c thnh vin gia ?nh ??nh t? v?n  pha?n ??ng b?t th???ng ho??c di? ??ng v??i amitriptyline  pha?n ??ng b?t th???ng ho??c di? ??ng v??i ca?c d??c ph?m kha?c  pha?n ??ng b?t th???ng ho??c di? ??ng v??i th??c ph?m, thu?c nhu?m, ho??c ch?t ba?o qua?n  ?ang c thai ho??c ??nh co? thai  ?ang cho con bu? Ti nn s? d?ng thu?c ny nh? th? no? U?ng thu?c ny v?i m?t ly n??c. Hy lm theo cc h??ng d?n trn h?p thu?c ho?c nhn thu?c. Qu v? c th? u?ng thu?c ny cng ho?c khng cng v?i th?c ?n. Dng thu?c ny vo nh?ng kho?ng th?i gian ??u nhau. Khng ???c dng thu?c ny nhi?u l?n h?n ? ???c ch? d?n. Khng ???c ng?ng s? d?ng thu?c ny m?t cch ??t ng?t, tr? khi bc s? c?a qu v? khuyn lm nh? v?y. Ng?ng s? d?ng thu?c ny qu nhanh c th? gy ra cc tc d?ng ph? nghim tr?ng ho?c tnh tr?ng c?a qu v? c th? tr? nn n?ng h?n. D??c s? s? ??a cho qu v? m?t B?n H??ng D?n v? D??c Ph?m (MedGuide) ??c bi?t cho m?i toa thu?c v cho m?i l?n mua thm thu?c ?. Hy b?o ??m ??c k? thng tin ny m?i l?n. Hy bn v?i bc s? nhi khoa c?a qu v? v? vi?c dng thu?c ny ? tr? em. C th? c?n ch?m [place] ??c bi?t. Qu li?u: N?u qu v? cho r?ng mnh ? dng qu nhi?u thu?c  ny, th hy lin l?c v?i trung tm ki?m sot ch?t ??c ho?c phng c?p c?u ngay l?p t?c. L?U : Thu?c ny ch? dnh ring cho qu v?. Khng chia s? thu?c ny v?i nh?ng ng??i khc. N?u ti l? qun m?t li?u th sao? N?u qu v? l? qun m?t li?u thu?c, hy dng li?u thu?c ? ngay khi c th?. N?u h?u nh? ? ??n gi? dng li?u thu?c k? ti?p, th ch? dng li?u thu?c k? ti?p ?Marland Kitchen Khng ???c dng li?u g?p ?i ho?c dng thm li?u. Nh?ng g c th? t??ng tc v?i thu?c ny? Khng ???c dng thu?c ny cng v?i b?t k? th? no sau ?y:  th?ch tn (arsenic trioxide)  m?t s? thu?c dng ?? tr? nh?p tim khng ??u v cc v?n ?? v? tim khc  cisapride  droperidol  halofantrine  linezolid  cc d??c ph?m g?i l ch?t ?c ch? men monoamine oxidase (MAO), ch?ng h?n nh? Nardil, Parnate, Marplan, Eldepryl, Carbex  xanh methylene  cc thu?c khc dng cho ch?ng tr?m c?m  cc  thu?c nhm phenothiazine, ch?ng h?n nh? perphenazine, thioridazine v chlorpromazine  pimozide  probucol  procarbazine  sparfloxacin  cy [facility] (c? [facility]/cy n?c s?i/cy l?nh) Thu?c ny c?ng c th? t??ng tc v?i cc thu?c sau ?y:  atropine v cc thu?c c lin quan, ch?ng h?n nh? hyoscyamine, scopolamine, tolterodine v cc thu?c khc  cc thu?c nhm barbiturate dng ?? gy ng? ho?c ?? ?i?u tr? cc ch?ng co gi?t, ch?ng h?n nh? phenobarbital  cimetidine  disulfiram  ethchlorvynol  cc n?i ti?t t? tuy?n gip tr?ng, ch?ng h?n nh? levothyroxine  ziprasidone Danh sch ny c th? khng m t? ?? h?t cc t??ng tc c th? x?y ra. Hy ??a cho ng??i cung c?p d?ch v? y t? c?a mnh danh sch t?t c? cc thu?c, th?o d??c, cc thu?c khng c?n toa, ho?c cc ch? ph?m b? sung m qu v? dng. C?ng nn bo cho h? bi?t r?ng qu v? c ht thu?c, u?ng r??u, ho?c c s? d?ng ma ty tri php hay khng. Vi th? c th? t??ng tc v?i thu?c c?a qu v?. Ti c?n ph?i theo di ?i?u g trong khi dng thu?c ny? Hy bo cho bc s? ho?c chuyn vin y  t?, n?u cc tri?u ch?ng c?a qu v? khng kh h?n, ho?c tr? nn n?ng h?n. Hy ?i g?p bc s? ho?c [place] vin y t? ?? theo di ??nh k? s? c?i thi?n c?a qu v?. ?i?u quan tr?ng l ph?i ti?p t?c vi?c ?i?u tr? c?a qu v? nh? ? ???c bc s? k trong toa, v c th? m?t vi tu?n ?? th?y ???c tc d?ng ??y ?? c?a thu?c ny. B?nh nhn v gia ?nh c?a b?nh nhn nn coi ch?ng ch?ng tr?m c?m ho?c  ngh? t? v?n m?i xu?t hi?n ho?c tr? nn n?ng h?n. Ngoi ra, hy theo di m?i thay ??i ??t ng?t ho?c mnh li?t v? c?m xc, ch?ng h?n nh? c?m th?y lo l?ng, b?i r?i, ho?ng s?, cu k?nh, th h?n, gy h?n, b?c ??ng, b?t r?t d? d?i, kch ??ng qu m?c v hi?u ??ng thi qu, ho?c khng ng? ???c. N?u ?i?u ny x?y ra, ??c bi?t l vo th?i k? ??u c?a ??t ?i?u tr? ho?c sau khi thay ??i li?u dng, th hy lin l?c v?i bc s? ho?c chuyn vin y t?. Qu v? c th? b? bu?n ng? ho?c chng m?t. Khng ???c li xe, s? d?ng my mc, ho?c lm nh?ng vi?c c?n ph?i t?nh to cho t?i khi qu v? bi?t ???c thu?c ny ?nh h??ng ln qu v? nh? th? no. Khng ???c ng?i d?y ho?c ??ng d?y nhanh, ??c bi?t l khi qu v? l b?nh nhn l?n tu?i. ?i?u ny lm gi?m nguy c? b? chng m?t ho?c ng?t x?u. R??u c th? c?n tr? tc d?ng c?a thu?c ny. Hy trnh cc ?? u?ng c r??u. Khng ???c t? mnh ?i?u tr? ho, c?m l?nh, ho?c d? ?ng. Hy h?i bc s? ho?c chuyn vin y t? ?? ???c c? v?n. M?t s? thnh ph?n c?a thu?c c th? lm t?ng cc tc d?ng ph? c th? c. Qu v? c th? b? kh mi?ng. Nhai k?o cao su (chewing gum) khng c ???ng ho?c ng?m k?o c?ng c th? c ch. Hy u?ng nhi?u n??c. Hy lin l?c v?i bc s?, n?u v?n ?? nghim tr?ng ho?c khng ch?m d?t. Thu?c ny c th? gy kh m?t v nhn m?. N?u qu v? [place] p trng, th qu v? c th? c?m th?y kh ch?u ?i  cht. Thu?c bi tr?n d?ng nh? gi?t c th? c ch. Hy ??n g?p bc s?, n?u v?n ?? nghim tr?ng ho?c khng ch?m d?t. Thu?c ny c th? gy to bn. Hy c? g?ng ??i ti?n t?i thi?u t? 2 ??n 3 ngy m?t l?n. N?u qu v? khng ??i  ti?n ???c trong 3 ngy, th hy lin l?c v?i bc s? ho?c chuyn vin y t?. Thu?c ny c th? lm cho qu v? b? nh?y c?m h?n v?i nh n?ng. Hy trnh ra n?ng. N?u qu v? khng th? trnh ra n?ng, th hy m?c trang ph?c b?o v? v bi thu?c ch?ng n?ng. Khng ???c dng cc ?n chi?u nh n?ng (sun lamps) ho?c gi??ng ho?c bu?ng dng ?? t?o ln da rm n?ng (sun tanning beds or booths). Ti c th? nh?n th?y nh?ng tc d?ng ph? no khi dng thu?c ny? Nh?ng tc d?ng ph? qu v? c?n ph?i bo cho bc s? ho?c chuyn vin y t? cng s?m cng t?t:  cc ph?n ?ng d? ?ng, ch?ng h?n nh? da b? m?n ??, ng?a, n?i my ?ay, s?ng ? m?t, mi, ho?c l??i  c?m th?y lo l?ng  kh th?  thay ??i th? l?c  l l?n  tm tr?ng ph?n khch, gi?m nhu c?u ng?, cc  ngh? thay ??i lia l?a, hnh vi b?c ??ng  ?au ? m?t  tim ??p nhanh ho?c khng ??u  c?m th?y chong vng, ng?t x?u, b? t  c?m th?y kch ??ng, t?c gi?n, ho?c b?t r?t  s?t km ra m? hi nhi?u h?n  ?o gic, m?t lin h? v?i th?c t?i  co gi?t (kinh phong)  c? b?p c?ng ??  cc  ngh? t? v?n ho?c nh?ng thay ??i v? tm tr?ng khc  ?au, t ho?c c?m gic nh? b? ki?n b ? bn tay ho?c bn chn  kh ?i ti?u ho?c thay ??i l??ng n??c ti?u ???c bi ti?t  kh ng?  m?t m?i ho?c y?u ?t b?t th??ng  i m?a  vng da ho?c m?t Cc tc d?ng ph? khng c?n ph?i ch?m [place] y t? (hy bo cho bc s? ho?c chuyn vin y t?, n?u cc tc d?ng ph? ny ti?p di?n ho?c gy phi?n toi):  cc thay ??i v? ham mu?n tnh d?c ho?c kh? n?ng tnh d?c  thay ??i c?m gic thm ?n ho?c thay ??i cn n?ng  to bn  chng m?t  kh mi?ng  bu?n i  c?m th?y m?t m?i  run r?y  kh ch?u ? bao t? Danh sch ny c th? khng m t? ?? h?t cc tc d?ng ph? c th? x?y ra. Xin g?i t?i bc s? c?a mnh ?? ???c c? v?n chuyn mn v? cc tc d?ng ph?Ladell Heads v? c th? t??ng trnh cc tc d?ng ph? cho FDA theo s? [phone]. Ti nn c?t gi? thu?c c?a mnh ? ?u? ?? ngoi t?m tay tr? em. C?t gi? ? nhi?t  ?? phng t? 20 ??n 25 ?? C (66 ??n 75 ?? F). V?t b? t?t c? thu?c ch?a dng sau ngy h?t h?n in trn nhn thu?c ho?c bao thu?c. L?U : ?y l b?n tm t?t. N c th? khng bao hm t?t c? thng tin c th? c. N?u qu v? th?c m?c v? thu?c ny, xin trao ??i v?i bc s?, d??c s?, ho?c ng??i cung c?p d?ch v? y t? c?a mnh.  2020 Elsevier/Gold Standard (2019-04-01 00:00:00)   Nabumetone tablets ?y l thu?c g? NABUMETONE l thu?c khng vim khng  ph?i steroid (non-steroidal anti-inflammatory drug - NSAID). N ???c dng ?? gi?m s?ng v ?? tr? c?n ?au. N ???c dng cho b?nh vim x??ng kh?p ho?c vim kh?p d?ng th?p. Thu?c ny c th? ???c dng cho nh?ng m?c ?ch khc; hy h?i ng??i cung c?p d?ch v? y t? ho?c d??c s? c?a mnh, n?u qu v? c th?c m?c. (CC) NHN HI?U PH? BI?N: [place], Relafen DS Ti c?n ph?i bo cho ng??i cung c?p d?ch v? y t? c?a mnh ?i?u g tr??c khi dng thu?c ny? H? c?n bi?t li?u qu v? c b?t k? tnh tr?ng no sau ?y khng:  ng??i hu?t thu?c la?  gi?i ph?u b?c c?u n?i ??ng m?ch va?nh (CABG) trong vo?ng 2 tu?n qua  u?ng nhi?u h?n 3 su?t ?? u?ng co? r??u m?i nga?y  b?nh tim  huy?t a?p cao  ti?n s? ch?y mu bao t?  b?nh th?n  b?nh gan  b?nh ph?i ho??c h h?p, ch??ng ha?n nh? hen suy?n  pha?n ??ng b?t th???ng ho??c d? ??ng v??i nabumetone, aspirin, ho??c ca?c thu?c khng vim khng ph?i steroid (NSAID) kha?c  pha?n ??ng b?t th???ng ho??c di? ??ng v??i ca?c d??c ph?m kha?c, th?c ph?m, thu?c nhu?m, ho??c ch?t ba?o qua?n  ?ang c thai ho??c ??nh co? thai  ?ang cho con bu? Ti nn s? d?ng thu?c ny nh? th? no? U?ng thu?c ny v?i m?t ly n??c ??y. Hy lm theo cc h??ng d?n trn h?p thu?c ho?c nhn thu?c. Qu v? c th? u?ng thu?c ny cng ho?c khng cng v?i th?c ?n. N?u thu?c lm kh ch?u bao t? qu v?, th hy u?ng thu?c cng v?i th?c ?n. C? g?ng khng n?m xu?ng t?i thi?u 10 pht, sau khi qu v? u?ng thu?c ny. Dng thu?c ny vo nh?ng kho?ng th?i gian ??u  nhau. Khng ???c dng thu?c ny nhi?u l?n h?n ? ???c ch? d?n. Vi?c s? d?ng lin t?c, di h?n c th? lm t?ng nguy c? nh?i mu c? tim ho?c ??t qu?Marland Kitchen D??c s? s? ??a cho qu v? m?t B?n H??ng D?n v? D??c Ph?m (MedGuide) ??c bi?t cho m?i toa thu?c v cho m?i l?n mua thm thu?c ?. Hy b?o ??m ??c k? thng tin ny m?i l?n. Hy bn v?i bc s? nhi khoa c?a qu v? v? vi?c dng thu?c ny ? tr? em. C th? c?n ch?m [place] ??c bi?t. Qu li?u: N?u qu v? cho r?ng mnh ? dng qu nhi?u thu?c ny, th hy lin l?c v?i trung tm ki?m sot ch?t ??c ho?c phng c?p c?u ngay l?p t?c. L?U : Thu?c ny ch? dnh ring cho qu v?. Khng chia s? thu?c ny v?i nh?ng ng??i khc. N?u ti l? qun m?t li?u th sao? N?u qu v? l? qun m?t li?u thu?c, hy dng li?u thu?c ? ngay khi c th?. N?u h?u nh? ? ??n gi? dng li?u thu?c k? ti?p, th ch? dng li?u thu?c k? ti?p ?Marland Kitchen Khng ???c dng li?u g?p ?i ho?c dng thm li?u. Nh?ng g c th? t??ng tc v?i thu?c ny?  r??u  aspirin  cidofovir  cc thu?c l?i ti?u  lithium  cc thu?c dng ?? tr? huy?t p cao  methotrexate  cc thu?c khng vim khc, ch?ng h?n nh? ketorolac, ibuprofen, v prednisone  pemetrexed  warfarin Danh sch ny c th? khng m t? ?? h?t cc t??ng tc c th? x?y ra. Hy ??a cho ng??i cung c?p d?ch v? y t? c?a mnh danh sch t?t c? cc thu?c, th?o d??c, cc  thu?c khng c?n toa, ho?c cc ch? ph?m b? sung m qu v? dng. C?ng nn bo cho h? bi?t r?ng qu v? c ht thu?c, u?ng r??u, ho?c c s? d?ng ma ty tri php hay khng. Vi th? c th? t??ng tc v?i thu?c c?a qu v?. Ti c?n ph?i theo di ?i?u g trong khi dng thu?c ny? Hy bo cho bc s? ho?c chuyn vin y t?, n?u c?n ?au c?a qu v? khng ??. Hy bo cho bc s? ho?c chuyn vin y t? tr??c khi dng lo?i thu?c gi?m ?au khc. Khng ???c t? ?i?u tr? cho mnh. Thu?c ny khng phng ng?a ???c nh?i mu c? tim ho?c ??t qu?Marland Kitchen. Th?c ra, thu?c ny c th? lm t?ng nguy c? nh?i mu c? tim ho?c ??t qu?Marland Kitchen. Nguy c? ny  c th? t?ng ln cng v?i vi?c s? d?ng lu di thu?c ny v ? ng??i c b?nh tim. N?u qu v? dng aspirin ?? phng ng?a nh?i mu c? tim ho?c ??t qu?, th hy bn v?i bc s? ho?c chuyn vin y t?. Thu?c ny c th? gy ra nh?ng ph?n ?ng nghim tr?ng trn da. Nh?ng ph?n ?ng ny c th? x?y ra t? vi tu?n ??n vi thng sau khi b?t ??u dng thu?c. Hy lin l?c ngay v?i bc s? ho?c chuyn vin y t?, n?u qu v? b? s?t ho?c c tri?u ch?ng gi?ng nh? cm km theo n?i ban. Ban c th? c mu ?? ho?c tm v sau ? chuy?n thnh ph?ng r?p ho?c bong trc da. Ho?c, qu v? c th? th?y n?i ban ?? km theo s?ng ? m?t, mi ho?c h?ch b?ch huy?t ? c? ho?c d??i cnh tay. Khng ???c dng cc thu?c ch?ng h?n nh? ibuprofen v naproxen chung v?i thu?c ny. Cc tc d?ng ph? nh? kh ch?u ? bao t?, bu?n i, ho?c lot c th? d? x?y ra h?n. C nhi?u lo?i thu?c khng c?n k toa khng ???c dng chung v?i thu?c ny. Thu?c ny c th? gy ra lot v ch?y mu bao t? v ru?t vo b?t k? lc no trong th?i gian ?i?u tr?Imagene Sheller. Khng ???c ht thu?c l ho?c u?ng r??u. Nh?ng th? ny lm t?ng kch ?ng bao t?. Chng c th? khi?n cho bao t? m?n c?m h?n v?i t?n h?i do thu?c ny gy ra. Lot v ch?y mu c th? x?y ra m khng c tri?u ch?ng c?nh bo v c th? gy t? vong. Qu v? c th? b? bu?n ng? ho?c chng m?t. Khng ???c li xe, s? d?ng my mc, ho?c lm nh?ng vi?c c?n ph?i t?nh to cho t?i khi qu v? bi?t ???c thu?c ny ?nh h??ng ln qu v? nh? th? no. Khng ???c ng?i d?y ho?c ??ng d?y nhanh, ??c bi?t l khi qu v? l b?nh nhn l?n tu?i. ?i?u ny lm gi?m nguy c? b? chng m?t ho?c ng?t x?u. Thu?c ny c th? lm cho qu v? d? b? ch?y mu h?n. Hy trnh lm t?n th??ng r?ng v n??u khi qu v? ?nh r?ng ho?c x?a r?ng b?ng ch? nha khoa. Ti c th? nh?n th?y nh?ng tc d?ng ph? no khi dng thu?c ny? Nh?ng tc d?ng ph? qu v? c?n ph?i bo cho bc s? ho?c chuyn vin y t? cng s?m cng t?t:  phn mu ?en ho?c c mu, ?i ti?u ho?c nn ra mu  m? m?t  ?au  ng?c  th? kh kh ho?c kh th?  bu?n i ho?c i m?a  m?n ??, r?p da, bong ho?c  trc da, bao g?m bn trong mi?ng.  da b? n?i ban, ng?a, ho?c n?i my ?ay  ni lu gi?ng ho?c y?u m?t bn ng??i  ?au b?ng d? d?i  s?ng ? m m?t, h?ng ho?c mi  t?ng cn ho?c s?ng khng r nguyn nhn  m?t m?i ho?c y?u ?t b?t th??ng  vng da ho?c m?t Cc tc d?ng ph? khng c?n ph?i ch?m [place] y t? (hy bo cho bc s? ho?c chuyn vin y t?, n?u cc tc d?ng ph? ny ti?p di?n ho?c gy phi?n toi):  to bn ho?c tiu ch?y  ??y h?i ho?c ? chua [facility] sch ny c th? khng m t? ?? h?t cc tc d?ng ph? c th? x?y ra. Xin g?i t?i bc s? c?a mnh ?? ???c c? v?n chuyn mn v? cc tc d?ng ph?Ladell Heads v? c th? t??ng trnh cc tc d?ng ph? cho FDA theo s? [phone]. Ti nn c?t gi? thu?c c?a mnh ? ?u? ?? ngoi t?m tay tr? em. C?t gi? ? nhi?t ?? phng t? 15 ??n 30 ?? C (57 ??n 84 ?? F). ?ng ch?t gi/h?p thu?c. V?t b? t?t c? thu?c ch?a dng sau ngy h?t h?n in trn nhn thu?c ho?c bao thu?c. L?U : ?y l b?n tm t?t. N c th? khng bao hm t?t c? thng tin c th? c. N?u qu v? th?c m?c v? thu?c ny, xin trao ??i v?i bc s?, d??c s?, ho?c ng??i cung c?p d?ch v? y t? c?a mnh.  2020 Elsevier/Gold Standard (2019-01-06 00:00:00)   Tension Headache, Adult A tension headache is a feeling of pain, pressure, or aching in the head that is often felt over the front and sides of the head. The pain can be dull, or it can feel tight (constricting). There are two types of tension headache:  Episodic tension headache. This is when the headaches happen fewer than 15 days a month.  Chronic tension headache. This is when the headaches happen more than 15 days a month during a 80-month period. A tension headache can last from 30 minutes to several days. It is the most common kind of headache. Tension headaches are not normally associated with nausea or vomiting, and they do not get worse with physical activity. What are the  causes? The exact cause of this condition is not known. Tension headaches are often triggered by stress, anxiety, or depression. Other triggers include:  Alcohol.  Too much caffeine or caffeine withdrawal.  Respiratory infections, such as colds, flu, or sinus infections.  Dental problems or teeth clenching.  Tiredness (fatigue).  Holding your head and neck in the same position for a long period of time, such as while using a computer.  Smoking.  Arthritis of the neck. What are the signs or symptoms? Symptoms of this condition include:  A feeling of pressure or tightness around the head.  Dull, aching head pain.  Pain over the front and sides of the head.  Tenderness in the muscles of the head, neck, and shoulders. How is this diagnosed? This condition may be diagnosed based on your symptoms, your medical history, and a physical exam. If your symptoms are severe or unusual, you may have imaging tests, such as a CT scan or an MRI of your head. Your vision may also be checked. How is this treated? This condition may be treated with lifestyle changes and with medicines that help relieve symptoms. Follow these instructions at home: Managing pain  Take over-the-counter and prescription medicines only as  told by your health care provider.  When you have a headache, lie down in a dark, quiet room.  If directed, apply ice to the head and neck: ? Put ice in a plastic bag. ? Place a towel between your skin and the bag. ? Leave the ice on for 20 minutes, 2-3 times a day.  If directed, apply heat to the back of your neck as often as told by your health care provider. Use the heat source that your health care provider recommends, such as a moist heat pack or a heating pad. ? Place a towel between your skin and the heat source. ? Leave the heat on for 20-30 minutes. ? Remove the heat if your skin turns bright red. This is especially important if you are unable to feel pain, heat, or  cold. You may have a greater risk of getting burned. Eating and drinking  Eat meals on a regular schedule.  Limit alcohol intake to no more than 1 drink a day for nonpregnant women and 2 drinks a day for men. One drink equals 12 oz of beer, 5 oz of wine, or 1 oz of hard liquor.  Drink enough fluid to keep your urine pale yellow.  Decrease your caffeine intake, or stop using caffeine. Lifestyle  Get 7-9 hours of sleep each night, or get the amount of sleep recommended by your health care provider.  At bedtime, remove all electronic devices from your room. Electronic devices include computers, phones, and tablets.  Find ways to manage your stress. Some things that can help relieve stress include: ? Exercise. ? Deep breathing exercises. ? Yoga. ? Listening to music. ? Positive mental imagery.  Try to sit up straight and avoid tensing your muscles.  Do not use any products that contain nicotine or tobacco, such as cigarettes and e-cigarettes. If you need help quitting, ask your health care provider. General instructions   Keep all follow-up visits as told by your health care provider. This is important.  Avoid any headache triggers. Keep a headache journal to help find out what may trigger your headaches. For example, write down: ? What you eat and drink. ? How much sleep you get. ? Any change to your diet or medicines. Contact a health care provider if:  Your headache does not get better.  Your headache comes back.  You are sensitive to sounds, light, or smells because of a headache.  You have nausea or you vomit.  Your stomach hurts. Get help right away if:  You suddenly develop a very severe headache along with any of the following: ? A stiff neck. ? Nausea and vomiting. ? Confusion. ? Weakness. ? Double vision or loss of vision. ? Shortness of breath. ? Rash. ? Unusual sleepiness. ? Fever. ? Trouble speaking. ? Pain in your eyes or ears. ? Trouble walking  or balancing. ? Feeling faint or passing out. Summary  A tension headache is a feeling of pain, pressure, or aching in the head that is often felt over the front and sides of the head.  A tension headache can last from 30 minutes to several days. It is the most common kind of headache.  This condition may be diagnosed based on your symptoms, your medical history, and a physical exam.  This condition may be treated with lifestyle changes and with medicines that help relieve symptoms. This information is not intended to replace advice given to you by your health care provider. Make sure you discuss any  questions you have with your health care provider. Document Revised: 06/04/2019 Document Reviewed: 11/17/2016 Elsevier Patient Education  2020 [employer].

## 2019-12-29 ENCOUNTER — Ambulatory Visit: Payer: Medicare Other | Attending: Internal Medicine

## 2019-12-29 DIAGNOSIS — Z23 Encounter for immunization: Secondary | ICD-10-CM

## 2019-12-29 DIAGNOSIS — R1013 Epigastric pain: Secondary | ICD-10-CM | POA: Diagnosis not present

## 2019-12-29 NOTE — Progress Notes (Signed)
   Covid-19 Vaccination Clinic  Name:  Debbie Stevens    MRN: [id number] DOB: 12/11/1950  12/29/2019  [name] was observed post Covid-19 immunization for 15 minutes without incident. She was provided with Vaccine Information Sheet and instruction to access the V-Safe system.   [name] was instructed to call 911 with any severe reactions post vaccine: Marland Kitchen Difficulty breathing  . Swelling of face and throat  . A fast heartbeat  . A bad rash all over body  . Dizziness and weakness   Immunizations Administered    Name Date Dose VIS Date Route   Pfizer COVID-19 Vaccine 12/29/2019  8:12 AM 0.3 mL 10/15/2018 Intramuscular   Manufacturer: [employer], [employer]   Lot: [id number]   NDC: [address]-6

## 2020-04-01 ENCOUNTER — Other Ambulatory Visit: Payer: Self-pay

## 2020-04-01 ENCOUNTER — Ambulatory Visit (INDEPENDENT_AMBULATORY_CARE_PROVIDER_SITE_OTHER): Payer: Medicare Other | Admitting: Adult Health

## 2020-04-01 VITALS — Ht 60.0 in | Wt 135.0 lb

## 2020-04-01 DIAGNOSIS — G44221 Chronic tension-type headache, intractable: Secondary | ICD-10-CM

## 2020-04-01 MED ORDER — AMITRIPTYLINE HCL 25 MG PO TABS: 25.0000 mg | ORAL_TABLET | Freq: Every day | ORAL | 11 refills | Status: DC

## 2020-04-01 NOTE — Patient Instructions (Signed)
Your Plan:  Continue amitriptyline If your symptoms worsen or you develop new symptoms please let us know.   Thank you for coming to see us at [facility]. I hope we have been able to provide you high quality care today.  You may receive a patient satisfaction survey over the next few weeks. We would appreciate your feedback and comments so that we may continue to improve ourselves and the health of our patients.  

## 2020-04-01 NOTE — Progress Notes (Signed)
PATIENT: Debbie Stevens DOB: 10/22/1950  REASON FOR VISIT: follow up HISTORY FROM: patient  HISTORY OF PRESENT ILLNESS: Today 04/01/20:  Debbie Stevens is a 69 year old female with a history of tension headaches.  She returns today for follow-up.  Interpreter is here with her.  Patient reports that her headaches have improved by 90%.  Reports that the medication amitriptyline has been very helpful.  States that she tolerates it well.  Returns today for an evaluation.  HISTORY (Copied from Dr.Dohmeier's note)  Debbie Garin Phanis a 69 y.o. year old [place] female patientseen here with interpreter-as a referralon 12/25/2019 for a headache evaluation.  Chiefconcernaccording to patient :  Debbie Stevens has suffered from headache in the crown and back of the head for 20 some years. She feels fine in AM- the headaches progress usually over the afternoon and become more severe towards the evening. There is no associated nausea, no photophobia.  The neck is tense and hurts between the shoulder blades when she hovers over her workspace. She is a Futures trader.   I have the pleasure of seeing Debbie Stevens today,a right-handed Asian female with a chronic headache.She  has a past medical history of GERD (gastroesophageal reflux disease) and Hypertension. She has been seen at [facility] may be 10 years ago. She reports having been prescribed NABUMETONE- No family history of headaches.   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 .  Living with daughter and grandchildren. Pets are present. 2 dogs.  Tobacco use: never . ETOH use; never ,  Caffeine intake in form of Coffee( used to drink 2 a day.   REVIEW OF SYSTEMS: Out of a complete 14 system review of symptoms, the patient complains only of the following symptoms, and all other reviewed systems are negative.  See HPI  ALLERGIES: Allergies    Allergen Reactions  . Amlodipine Other (See Comments)    Flushing  . Metoprolol Palpitations    HOME MEDICATIONS: Outpatient Medications Prior to Visit  Medication Sig Dispense Refill  . acetaminophen (TYLENOL 8 HOUR) 650 MG CR tablet Take 650 mg by mouth every 8 (eight) hours as needed for pain.    Marland Kitchen amitriptyline (ELAVIL) 25 MG tablet Take 1 tablet (25 mg total) by mouth at bedtime. 30 tablet 3  . Boswellia-Glucosamine-Vit D (GLUCOSAMINE COMPLEX PO) Take 1 tablet by mouth daily. Muscle/joint pain    . citalopram (CELEXA) 40 MG tablet Take 40 mg by mouth daily.     Marland Kitchen doxazosin (CARDURA) 1 MG tablet Take 2 mg by mouth daily.     Marland Kitchen esomeprazole (NEXIUM) 40 MG capsule     . hydrOXYzine (ATARAX) 25 MG tablet Take 25 mg by mouth 3 (three) times daily as needed.      . metoprolol tartrate (LOPRESSOR) 25 MG tablet Take 25 mg by mouth 2 (two) times daily.    . nabumetone (RELAFEN) 500 MG tablet Take 1 tablet (500 mg total) by mouth daily as needed. 30 tablet 1  . Omega-3 Fatty Acids (FISH OIL) 1000 MG CAPS Take 1,000 mg by mouth daily.    . pravastatin (PRAVACHOL) 40 MG tablet Take 40 mg by mouth daily.      . sucralfate (CARAFATE) 1 g tablet Take 1 tablet (1 g total) by mouth 4 (four) times daily -  with meals and at bedtime. 60 tablet 0  .  triamterene-hydrochlorothiazide (MAXZIDE-25) 37.5-25 MG tablet Take 1 tablet by mouth daily.     No facility-administered medications prior to visit.    PAST MEDICAL HISTORY: Past Medical History:  Diagnosis Date  . GERD (gastroesophageal reflux disease)   . Hypertension     PAST SURGICAL HISTORY: Past Surgical History:  Procedure Laterality Date  . BILATERAL OOPHORECTOMY    . KIDNEY SURGERY     right kidney removal.     FAMILY HISTORY: No family history on file.  SOCIAL HISTORY: Social History   Socioeconomic History  . Marital status: Divorced    Spouse name: Not on file  . Number of children: Not on file  . Years of education: Not  on file  . Highest education level: Not on file  Occupational History  . Not on file  Tobacco Use  . Smoking status: Never Smoker  . Smokeless tobacco: Never Used  Vaping Use  . Vaping Use: Never used  Substance and Sexual Activity  . Alcohol use: No  . Drug use: No  . Sexual activity: Not on file  Other Topics Concern  . Not on file  Social History Narrative  . Not on file   Social Determinants of Health   Financial Resource Strain:   . Difficulty of Paying Living Expenses:   Food Insecurity:   . Worried About Programme researcher, broadcasting/film/video in the Last Year:   . Barista in the Last Year:   Transportation Needs:   . Freight forwarder (Medical):   Marland Kitchen Lack of Transportation (Non-Medical):   Physical Activity:   . Days of Exercise per Week:   . Minutes of Exercise per Session:   Stress:   . Feeling of Stress :   Social Connections:   . Frequency of Communication with Friends and Family:   . Frequency of Social Gatherings with Friends and Family:   . Attends Religious Services:   . Active Member of Clubs or Organizations:   . Attends Banker Meetings:   Marland Kitchen Marital Status:   Intimate Partner Violence:   . Fear of Current or Ex-Partner:   . Emotionally Abused:   Marland Kitchen Physically Abused:   . Sexually Abused:       PHYSICAL EXAM  Vitals:   04/01/20 0857  Weight: 135 lb (61.2 kg)  Height: 5' (1.524 m)   Body mass index is 26.37 kg/m.  Generalized: Well developed, in no acute distress   Neurological examination  Mentation: Alert oriented to time, place, history taking. Follows all commands speech and language fluent Cranial nerve II-XII: Pupils were equal round reactive to light. Extraocular movements were full, visual field were full on confrontational test. . Head turning and shoulder shrug  were normal and symmetric. Motor: The motor testing reveals 5 over 5 strength of all 4 extremities. Good symmetric motor tone is noted throughout.  Sensory:  Sensory testing is intact to soft touch on all 4 extremities. No evidence of extinction is noted.  Coordination: Cerebellar testing reveals good finger-nose-finger and heel-to-shin bilaterally.  Gait and station: Gait is normal.  Reflexes: Deep tendon reflexes are symmetric and normal bilaterally.   DIAGNOSTIC DATA (LABS, IMAGING, TESTING) - I reviewed patient records, labs, notes, testing and imaging myself where available.  Lab Results  Component Value Date   WBC 6.2 12/13/2019   HGB 14.0 12/13/2019   HCT 42.2 12/13/2019   MCV 86.3 12/13/2019   PLT 218 12/13/2019      Component Value Date/Time  NA 133 (L) 12/13/2019 1602   K 3.6 12/13/2019 1602   CL 98 12/13/2019 1602   CO2 24 12/13/2019 1602   GLUCOSE 103 (H) 12/13/2019 1602   BUN 20 12/13/2019 1602   CREATININE 1.25 (H) 12/13/2019 1602   CALCIUM 9.9 12/13/2019 1602   PROT 7.7 12/13/2019 1602   ALBUMIN 4.4 12/13/2019 1602   AST 24 12/13/2019 1602   ALT 22 12/13/2019 1602   ALKPHOS 58 12/13/2019 1602   BILITOT 1.0 12/13/2019 1602   GFRNONAA 44 (L) 12/13/2019 1602   GFRAA 50 (L) 12/13/2019 1602      ASSESSMENT AND PLAN 69 y.o. year old female  has a past medical history of GERD (gastroesophageal reflux disease) and Hypertension. here with:  1.  Tension type headache   Continue amitriptyline  Advised if symptoms worsen or she develops new symptoms she should let us know  Follow-up in 1 year or sooner if needed   I spent 20 minutes of face-to-face and non-face-to-face time with patient.  This included previsit chart review, lab review, study review, order entry, electronic health record documentation, patient education.  Butch Penny, MSN, NP-C 04/01/2020, 8:30 AM [facility] [address], [address] [place], [place] [address] [phone]

## 2020-05-20 DIAGNOSIS — Z23 Encounter for immunization: Secondary | ICD-10-CM | POA: Diagnosis not present

## 2020-06-09 DIAGNOSIS — G44229 Chronic tension-type headache, not intractable: Secondary | ICD-10-CM | POA: Diagnosis not present

## 2020-06-09 DIAGNOSIS — Z789 Other specified health status: Secondary | ICD-10-CM | POA: Diagnosis not present

## 2020-06-09 DIAGNOSIS — I10 Essential (primary) hypertension: Secondary | ICD-10-CM | POA: Diagnosis not present

## 2020-06-09 DIAGNOSIS — F411 Generalized anxiety disorder: Secondary | ICD-10-CM | POA: Diagnosis not present

## 2020-06-09 DIAGNOSIS — E7800 Pure hypercholesterolemia, unspecified: Secondary | ICD-10-CM | POA: Diagnosis not present

## 2020-06-09 DIAGNOSIS — S9031XA Contusion of right foot, initial encounter: Secondary | ICD-10-CM | POA: Diagnosis not present

## 2020-06-17 DIAGNOSIS — R739 Hyperglycemia, unspecified: Secondary | ICD-10-CM | POA: Diagnosis not present

## 2020-07-09 DIAGNOSIS — Z23 Encounter for immunization: Secondary | ICD-10-CM | POA: Diagnosis not present

## 2020-08-16 ENCOUNTER — Ambulatory Visit: Payer: Medicare Other | Admitting: Dietician

## 2020-08-30 DIAGNOSIS — Z1231 Encounter for screening mammogram for malignant neoplasm of breast: Secondary | ICD-10-CM | POA: Diagnosis not present

## 2020-10-08 ENCOUNTER — Other Ambulatory Visit: Payer: Self-pay | Admitting: Neurology

## 2020-11-04 ENCOUNTER — Encounter: Payer: Self-pay | Admitting: Neurology

## 2020-11-04 ENCOUNTER — Ambulatory Visit (INDEPENDENT_AMBULATORY_CARE_PROVIDER_SITE_OTHER): Payer: Medicare Other | Admitting: Neurology

## 2020-11-04 VITALS — BP 126/82 | HR 83 | Ht 60.0 in | Wt 137.0 lb

## 2020-11-04 DIAGNOSIS — G44221 Chronic tension-type headache, intractable: Secondary | ICD-10-CM

## 2020-11-04 MED ORDER — NABUMETONE 500 MG PO TABS: 500.0000 mg | ORAL_TABLET | Freq: Every day | ORAL | 1 refills | Status: DC | PRN

## 2020-11-04 MED ORDER — AMITRIPTYLINE HCL 25 MG PO TABS: 25.0000 mg | ORAL_TABLET | Freq: Every day | ORAL | 11 refills | Status: DC

## 2020-11-04 NOTE — Patient Instructions (Signed)
Tension Headache, Adult A tension headache is a feeling of pain, pressure, or aching over the front and sides of the head. The pain can be dull, or it can feel tight. There are two types of tension headache:  Episodic tension headache. This is when the headaches happen fewer than 15 days a month.  Chronic tension headache. This is when the headaches happen more than 15 days a month during a 62-month period. A tension headache can last from 30 minutes to several days. It is the most common kind of headache. Tension headaches are not normally associated with nausea or vomiting, and they do not get worse with physical activity. What are the causes? The exact cause of this condition is not known. Tension headaches are often triggered by stress, anxiety, or depression. Other triggers may include:  Alcohol.  Too much caffeine or caffeine withdrawal.  Respiratory infections, such as colds, flu, or sinus infections.  Dental problems or teeth clenching.  Fatigue.  Holding your head and neck in the same position for a long period of time, such as while using a computer.  Smoking.  Arthritis of the neck. What are the signs or symptoms? Symptoms of this condition include:  A feeling of pressure or tightness around the head.  Dull, aching head pain.  Pain over the front and sides of the head.  Tenderness in the muscles of the head, neck, and shoulders. How is this diagnosed? This condition may be diagnosed based on your symptoms, your medical history, and a physical exam. If your symptoms are severe or unusual, you may have imaging tests, such as a CT scan or an MRI of your head. Your vision may also be checked. How is this treated? This condition may be treated with lifestyle changes and with medicines that help relieve symptoms. Follow these instructions at home: Managing pain  Take over-the-counter and prescription medicines only as told by your health care provider.  When  you have a headache, lie down in a dark, quiet room.  If directed, put ice on your head and neck. To do this: ? Put ice in a plastic bag. ? Place a towel between your skin and the bag. ? Leave the ice on for 20 minutes, 2-3 times a day. ? Remove the ice if your skin turns bright red. This is very important. If you cannot feel pain, heat, or cold, you have a greater risk of damage to the area.  If directed, apply heat to the back of your neck as often as told by your health care provider. Use the heat source that your health care provider recommends, such as a moist heat pack or a heating pad. ? Place a towel between your skin and the heat source. ? Leave the heat on for 20-30 minutes. ? Remove the heat if your skin turns bright red. This is especially important if you are unable to feel pain, heat, or cold. You have a greater risk of getting burned. Eating and drinking  Eat meals on a regular schedule.  If you drink alcohol: ? Limit how much you have to:  0-1 drink a day for women who are not pregnant.  0-2 drinks a day for men. ? Know how much alcohol is in your drink. In the [place] one drink equals one 12 oz bottle of beer (355 mL), one 5 oz glass of wine (148 mL), or one 1 oz glass of hard liquor (44 mL).  Drink enough fluid to  keep your urine pale yellow.  Decrease your caffeine intake, or stop using caffeine. Lifestyle  Get 7-9 hours of sleep each night, or get the amount of sleep recommended by your health care provider.  At bedtime, remove computers, phones, and tablets from your room.  Find ways to manage your stress. This may include: ? Exercise. ? Deep breathing exercises. ? Yoga. ? Listening to music. ? Positive mental imagery.  Try to sit up straight and avoid tensing your muscles.  Do not use any products that contain nicotine or tobacco. These include cigarettes, chewing tobacco, and vaping devices, such as e-cigarettes. If you need help quitting, ask your  health care provider. General instructions  Avoid any headache triggers. Keep a journal to help find out what may trigger your headaches. For example, write down: ? What you eat and drink. ? How much sleep you get. ? Any change to your diet or medicines.  Keep all follow-up visits. This is important.   Contact a health care provider if:  Your headache does not get better.  Your headache comes back.  You are sensitive to sounds, light, or smells because of a headache.  You have nausea or you vomit.  Your stomach hurts. Get help right away if:  You suddenly develop a severe headache, along with any of the following: ? A stiff neck. ? Nausea and vomiting. ? Confusion. ? Weakness in one part or one side of your body. ? Double vision or loss of vision. ? Shortness of breath. ? Rash. ? Unusual sleepiness. ? Fever or chills. ? Trouble speaking. ? Pain in your eye or ear. ? Trouble walking or balancing. ? Feeling faint or passing out. Summary  A tension headache is a feeling of pain, pressure, or aching over the front and sides of the head.  A tension headache can last from 30 minutes to several days. It is the most common kind of headache.  This condition may be diagnosed based on your symptoms, your medical history, and a physical exam.  This condition may be treated with lifestyle changes and with medicines that help relieve symptoms. This information is not intended to replace advice given to you by your health care provider. Make sure you discuss any questions you have with your health care provider. Document Revised: 05/06/2020 Document Reviewed: 05/06/2020 Elsevier Patient Education  2021 Elsevier Inc.  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  Refill of NABUMETONE for breakthrough pain prn also in  daytime.  Take celexa in AM - at least 10 hours apart from amitriptyline.  I offered the patient to return for trigger-point injection at the occipital region if the pain is not controlled, these can be performed by NP or MD.   I would like to thank Rankins, Fanny Dance, MD for allowing me to meet with and to take care of this pleasant patient.   I plan to follow up either personally or through our NP within 6 month. I offered the patient to return for trigger-point injection at the occipital region if the pain is not controlled, these can be performed by NP or MD.  She will be able to call and schedule.

## 2020-11-04 NOTE — Progress Notes (Signed)
SLEEP MEDICINE CLINIC    Provider:  Melvyn Novas, MD  Primary Care Physician:  Clayborn Heron, MD [address] [place] [address]     Referring Provider: Clayborn Heron, Md [address] [place],  [place] [address]          Chief Complaint according to patient   Patient presents with:    . New Patient (Initial Visit)           HISTORY OF PRESENT ILLNESS:   11-04-2020: RV Debbie Stevens is a 70 y.o. year old [place] female patient seen here with interpreter-as a referral on 11/04/2020  for a headache evaluation.  She has been seen in consultation on 12-25-2019, and follow up with NP on 04-01-2020-  The patient reports that the initial treatment with amitriptyline was successful at 25 mg at night but after about 4 or 5 months of improvement the medication seem to have worn off.  She describes similar tension type headaches that involve the occipital area and back of the neck and that these have returned.   She states that the trigger for those headaches was lifting something heavy or pushing something heavy-  that may be indicative of aValsalva component.  There is no nausea and no photophobia, and not dependent on rotatory ROM in the neck.  She has reported the pain can get worse when flexing or retro-flexing the neck  She takes now nabumetone 500 mg prn , but continues Elavil 25 mg each night.     Chief concern according to patient :   Debbie Stevens has suffered from headache in the crown and back of the head for 20 some years. She feels fine in AM- the headaches progress usually over the afternoon and become more severe towards the evening. There is no associated nausea, no photophobia.  The neck is tense and hurts between the shoulder blades when she hovers over her workspace. She is a Futures trader.    I have the pleasure of seeing Debbie Stevens today, a right-handed Asian female with a chronic headache. She  has a past medical history of GERD  (gastroesophageal reflux disease) and Hypertension. She has been seen at [facility] may be 10 years ago. She reports having been prescribed NABUMETONE- No family history of headaches.    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 .  Living with daughter and grandchildren. Pets are present. 2 dogs.  Tobacco use: never .  ETOH use; never ,  Caffeine intake in form of Coffee( used to drink 2 a day.     Review of Systems: Out of a complete 14 system review, the patient complains of only the following symptoms, and all other reviewed systems are negative.:    Headaches - tension type, occipital nerve pain- decreased frequency, but nabumetone is taken about 2-3 /month.  Non migrainous.   Social History   Socioeconomic History  . Marital status: Divorced    Spouse name: Not on file  . Number of children: Not on file  . Years of education: Not on file  . Highest education level: Not on file  Occupational History  . Not on file  Tobacco Use  . Smoking status: Never Smoker  . Smokeless tobacco: Never Used  Vaping Use  . Vaping Use: Never used  Substance and Sexual Activity  .  Alcohol use: No  . Drug use: No  . Sexual activity: Not on file  Other Topics Concern  . Not on file  Social History Narrative  . Not on file   Social Determinants of Health   Financial Resource Strain: Not on file  Food Insecurity: Not on file  Transportation Needs: Not on file  Physical Activity: Not on file  Stress: Not on file  Social Connections: Not on file    Past Medical History:  Diagnosis Date  . GERD (gastroesophageal reflux disease)   . Hypertension     Past Surgical History:  Procedure Laterality Date  . BILATERAL OOPHORECTOMY    . KIDNEY SURGERY     right kidney removal.      Current Outpatient Medications on File Prior to Visit  Medication Sig Dispense Refill  . acetaminophen  (TYLENOL) 650 MG CR tablet Take 650 mg by mouth every 8 (eight) hours as needed for pain.    Marland Kitchen amitriptyline (ELAVIL) 25 MG tablet Take 1 tablet (25 mg total) by mouth at bedtime. 30 tablet 11  . Boswellia-Glucosamine-Vit D (GLUCOSAMINE COMPLEX PO) Take 1 tablet by mouth daily. Muscle/joint pain    . Cholecalciferol 25 MCG (1000 UT) capsule Take by mouth.    . citalopram (CELEXA) 40 MG tablet Take 40 mg by mouth daily.    Marland Kitchen doxazosin (CARDURA) 1 MG tablet Take 2 mg by mouth daily.    Marland Kitchen esomeprazole (NEXIUM) 40 MG capsule     . hydrOXYzine (ATARAX) 25 MG tablet Take 25 mg by mouth 3 (three) times daily as needed.    . metoprolol tartrate (LOPRESSOR) 25 MG tablet Take 25 mg by mouth 2 (two) times daily.    . nabumetone (RELAFEN) 500 MG tablet TAKE 1 TABLET BY MOUTH ONCE DAILY AS NEEDED 30 tablet 0  . Omega-3 Fatty Acids (FISH OIL) 1000 MG CAPS Take 1,000 mg by mouth daily.    . pravastatin (PRAVACHOL) 40 MG tablet Take 40 mg by mouth daily.    . sucralfate (CARAFATE) 1 g tablet Take 1 tablet (1 g total) by mouth 4 (four) times daily -  with meals and at bedtime. 60 tablet 0  . triamterene-hydrochlorothiazide (MAXZIDE-25) 37.5-25 MG tablet Take 1 tablet by mouth daily.     No current facility-administered medications on file prior to visit.    Allergies  Allergen Reactions  . Amlodipine Other (See Comments)    Flushing  . Metoprolol Palpitations    Physical exam:  Today's Vitals   11/04/20 1317  BP: 126/82  Pulse: 83  Weight: 137 lb (62.1 kg)  Height: 5' (1.524 m)   Body mass index is 26.76 kg/m.   Wt Readings from Last 3 Encounters:  11/04/20 137 lb (62.1 kg)  04/01/20 135 lb (61.2 kg)  12/25/19 128 lb (58.1 kg)     Ht Readings from Last 3 Encounters:  11/04/20 5' (1.524 m)  04/01/20 5' (1.524 m)  12/25/19 5\' 2"  (1.575 m)      General: The patient is awake, alert and appears not in acute distress. The patient is well groomed. Head: Normocephalic, atraumatic. Neck is  supple. Mallampati 2  neck circumference:14 inches . Nasal airflow  patent.  Retrognathia is  seen.   Cardiovascular:  Regular rate and cardiac rhythm by pulse,  without distended neck veins. Respiratory: Lungs are clear to auscultation.  Skin:  Without evidence of ankle edema, or rash. Trunk: The patient's posture is erect.   Neurologic exam : The  patient is awake and alert, oriented to place and time.   Memory subjective described as intact.  Attention span & concentration ability appears normal.  Speech is fluent,  without  dysarthria, dysphonia or aphasia.  Mood and affect are appropriate.   Cranial nerves: no loss of smell or taste reported  Pupils are equal and briskly reactive to light. Funduscopic exam deferred.   Extraocular movements in vertical and horizontal planes were intact and without nystagmus. No Diplopia. Visual fields by finger perimetry are intact. Hearing was impaired to soft voice and some tinnitus.   Facial motor strength is symmetric and her tongue and uvula move midline.  Neck ROM :  Crackling and pain rotation,    TENSION radiating al the way up to occipital region. and flexion/ extension remain restricted , the shoulder shrug was not symmetrical.   Right shoulder shrug is lower and ROM restricted.    Motor exam:  Symmetric bulk, tone and ROM.   Normal tone without cog- wheeling, symmetric grip strength .   Sensory:  Fine touch, pinprick and vibration were tested  and  normal.   Coordination:  The Finger-to-nose maneuver was intact without evidence of ataxia, dysmetria or tremor.   Gait and station: Patient could rise unassisted from a seated position, walked without assistive device.  Stance is of normal width/ base and the patient turned with 3 steps.   Deep tendon reflexes: in the  upper and lower extremities are symmetric and intact.  Babinski response was deferred .      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  Refill of NABUMETONE for breakthrough pain prn also in daytime.  Take celexa in AM - at least 10 hours apart from amitriptyline.  I offered the patient to return for trigger-point injection at the occipital region if the pain is not controlled, these can be performed by NP or MD.   I would like to thank Rankins, Fanny Dance, MD for allowing me to meet with and to take care of this pleasant patient.   I plan to follow up either personally or through our NP within 6 month. I offered the patient to return for trigger-point injection at the occipital region if the pain is not controlled, these can be performed by NP or MD.  She will be able to call and schedule.   CC: I will share my notes with PCP.   Electronically signed by: Melvyn Novas, MD 11/04/2020 2:09 PM  [facility] Board certified by The [employer] of Sleep Medicine and Diplomate of the [employer] of Sleep Medicine. Board certified In Neurology through the ABPN, Fellow of the [employer] of Neurology. Medical Director of [facility].

## 2020-11-08 ENCOUNTER — Other Ambulatory Visit: Payer: Self-pay | Admitting: Neurology

## 2020-11-08 MED ORDER — AMITRIPTYLINE HCL 50 MG PO TABS: 50.0000 mg | ORAL_TABLET | Freq: Every day | ORAL | 5 refills | Status: DC

## 2020-12-21 ENCOUNTER — Other Ambulatory Visit: Payer: Self-pay | Admitting: Neurology

## 2020-12-21 ENCOUNTER — Telehealth: Payer: Self-pay | Admitting: Neurology

## 2020-12-21 MED ORDER — AMITRIPTYLINE HCL 50 MG PO TABS: 50.0000 mg | ORAL_TABLET | Freq: Every day | ORAL | 1 refills | Status: DC

## 2020-12-21 NOTE — Telephone Encounter (Signed)
Pt's daughter, Fernand Parkins (on [place]) called, her amitriptyline (ELAVIL) 50 MG tablet was increase to 2 tablets a day. Her prescription only has 30 tablets. She will run out of medication before she can get a refill. Would like a call from the nurse.

## 2020-12-21 NOTE — Telephone Encounter (Signed)
Called back and advised a prescription can already been sent to the pharmacy.  Patient's daughter asked if the prescription can be changed to a 72-month supply.  Informed I would do that and resendt to the pharmacy for the patient.  She was appreciative for the call back.

## 2020-12-21 NOTE — Telephone Encounter (Signed)
Called the daughter back to advise that a script was already sent into the [facility] pharmacy for the patient. She answered and had found that out. She asked if the med could be changed to a 90 day supply to limit the trips patient would have to drive to [facility]. Advised I would change the script to 90 tab (3 mth supply) and send to pharmacy. She was appreciative for the call back.

## 2020-12-22 DIAGNOSIS — F411 Generalized anxiety disorder: Secondary | ICD-10-CM | POA: Diagnosis not present

## 2020-12-22 DIAGNOSIS — R7303 Prediabetes: Secondary | ICD-10-CM | POA: Diagnosis not present

## 2020-12-22 DIAGNOSIS — Z0000 Encounter for general adult medical examination without abnormal findings: Secondary | ICD-10-CM | POA: Diagnosis not present

## 2020-12-22 DIAGNOSIS — E7800 Pure hypercholesterolemia, unspecified: Secondary | ICD-10-CM | POA: Diagnosis not present

## 2020-12-22 DIAGNOSIS — I10 Essential (primary) hypertension: Secondary | ICD-10-CM | POA: Diagnosis not present

## 2021-01-16 ENCOUNTER — Other Ambulatory Visit: Payer: Self-pay | Admitting: Neurology

## 2021-01-31 DIAGNOSIS — Z23 Encounter for immunization: Secondary | ICD-10-CM | POA: Diagnosis not present

## 2021-03-14 ENCOUNTER — Telehealth: Payer: Self-pay | Admitting: Neurology

## 2021-03-14 NOTE — Telephone Encounter (Signed)
LVM returning daughter's call (on DPR). Shawnie Dapper, NP agreed to do an occipital nerve block on pt since MM,NP out and no appt available this week w/ Dr. Vickey Huger.  Can offer today at 230pm w/ AL,NP or 03/16/21 at 3pm if daughter calls back (appt on hold)

## 2021-03-14 NOTE — Telephone Encounter (Signed)
Noted  

## 2021-03-14 NOTE — Telephone Encounter (Signed)
Pt's daughter called back, she accepted 7-27 3pm(2:30 check in) with Amy, NP , this is FYI no call back requested

## 2021-03-14 NOTE — Telephone Encounter (Signed)
Pt's Daughter Debbie Stevens on [place] called stating that the pt called her and informed her that her amitriptyline (ELAVIL) 50 MG tablet was increased and even with the increase her headaches are not getting better. Daughter states that the pt is wanting to go ahead and try the injections that were mentioned to her. Please advise.

## 2021-03-16 ENCOUNTER — Encounter: Payer: Self-pay | Admitting: Family Medicine

## 2021-03-16 ENCOUNTER — Ambulatory Visit (INDEPENDENT_AMBULATORY_CARE_PROVIDER_SITE_OTHER): Payer: Medicare Other | Admitting: Family Medicine

## 2021-03-16 DIAGNOSIS — G44221 Chronic tension-type headache, intractable: Secondary | ICD-10-CM | POA: Diagnosis not present

## 2021-03-16 NOTE — Progress Notes (Signed)
     History: She has continues amitriptyline with some improvement. She continues to have tension and pain in bilateral occipital region of head that radiates to neck and shoulder. She requests occipital nerve block as mentioned by Dr Vickey Huger.     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 2 other injections were done one finger breadth from the initial injection, one at a 10 o'clock position and the other at a 2 o'clock position.  The patient tolerated the injections well, no complications of the procedure were noted. Injections were made with a 27-gauge needle.

## 2021-03-31 DIAGNOSIS — H25043 Posterior subcapsular polar age-related cataract, bilateral: Secondary | ICD-10-CM | POA: Diagnosis not present

## 2021-03-31 DIAGNOSIS — H25013 Cortical age-related cataract, bilateral: Secondary | ICD-10-CM | POA: Diagnosis not present

## 2021-03-31 DIAGNOSIS — H52223 Regular astigmatism, bilateral: Secondary | ICD-10-CM | POA: Diagnosis not present

## 2021-03-31 DIAGNOSIS — H2513 Age-related nuclear cataract, bilateral: Secondary | ICD-10-CM | POA: Diagnosis not present

## 2021-03-31 DIAGNOSIS — H5203 Hypermetropia, bilateral: Secondary | ICD-10-CM | POA: Diagnosis not present

## 2021-03-31 DIAGNOSIS — H524 Presbyopia: Secondary | ICD-10-CM | POA: Diagnosis not present

## 2021-04-01 ENCOUNTER — Telehealth: Payer: Self-pay | Admitting: Family Medicine

## 2021-04-01 NOTE — Telephone Encounter (Signed)
Pt's daughter states even after the nerve block pt is not feeling better, she would like a call to discuss other treatment options.

## 2021-04-04 ENCOUNTER — Ambulatory Visit: Payer: Medicare Other | Admitting: Adult Health

## 2021-04-04 NOTE — Telephone Encounter (Signed)
Called daughter and scheduled work in for 04/06/21 at 1:30p w/ Dr. Vickey Huger to discuss other treatment options. Made sure it was checked that interpreter needed for appt per daughter request.

## 2021-04-06 ENCOUNTER — Ambulatory Visit (INDEPENDENT_AMBULATORY_CARE_PROVIDER_SITE_OTHER): Payer: Medicare Other | Admitting: Neurology

## 2021-04-06 ENCOUNTER — Encounter: Payer: Self-pay | Admitting: Neurology

## 2021-04-06 VITALS — BP 141/93 | HR 84 | Ht 60.0 in | Wt 136.5 lb

## 2021-04-06 DIAGNOSIS — G44221 Chronic tension-type headache, intractable: Secondary | ICD-10-CM

## 2021-04-06 DIAGNOSIS — M542 Cervicalgia: Secondary | ICD-10-CM

## 2021-04-06 MED ORDER — GABAPENTIN 100 MG PO CAPS: 100.0000 mg | ORAL_CAPSULE | Freq: Every day | ORAL | 5 refills | Status: DC

## 2021-04-06 NOTE — Patient Instructions (Signed)
Gabapentin Capsules or Tablets What is this medication? GABAPENTIN (GA ba pen tin) treats nerve pain. It may also be used to prevent and control seizures in people with epilepsy. It works by calming overactivenerves in your body. This medicine may be used for other purposes; ask your health care provider orpharmacist if you have questions. COMMON BRAND NAME(S): Active-PAC with Gabapentin, Gabarone, Gralise, Neurontin What should I tell my care team before I take this medication? They need to know if you have any of these conditions: Alcohol or substance use disorder Kidney disease Lung or breathing disease Suicidal thoughts, plans, or attempt; a previous suicide attempt by you or a family member An unusual or allergic reaction to gabapentin, other medications, foods, dyes, or preservatives Pregnant or trying to get pregnant Breast-feeding How should I use this medication? Take this medication by mouth with a glass of water. Follow the directions on the prescription label. You can take it with or without food. If it upsets your stomach, take it with food. Take your medication at regular intervals. Do not take it more often than directed. Do not stop taking except on your care team'sadvice. If you are directed to break the 600 or 800 mg tablets in half as part of your dose, the extra half tablet should be used for the next dose. If you have notused the extra half tablet within 28 days, it should be thrown away. A special MedGuide will be given to you by the pharmacist with eachprescription and refill. Be sure to read this information carefully each time. Talk to your care team about the use of this medication in children. While this medication may be prescribed for children as young as 3 years for selectedconditions, precautions do apply. Overdosage: If you think you have taken too much of this medicine contact apoison control center or emergency room at once. NOTE: This medicine is only for you.  Do not share this medicine with others. What if I miss a dose? If you miss a dose, take it as soon as you can. If it is almost time for yournext dose, take only that dose. Do not take double or extra doses. What may interact with this medication? Alcohol Antihistamines for allergy, cough, and cold Certain medications for anxiety or sleep Certain medications for depression like amitriptyline, fluoxetine, sertraline Certain medications for seizures like phenobarbital, primidone Certain medications for stomach problems General anesthetics like halothane, isoflurane, methoxyflurane, propofol Local anesthetics like lidocaine, pramoxine, tetracaine Medications that relax muscles for surgery Narcotic medications for pain Phenothiazines like chlorpromazine, mesoridazine, prochlorperazine, thioridazine This list may not describe all possible interactions. Give your health care provider a list of all the medicines, herbs, non-prescription drugs, or dietary supplements you use. Also tell them if you smoke, drink alcohol, or use illegaldrugs. Some items may interact with your medicine. What should I watch for while using this medication? Visit your care team for regular checks on your progress. You may want to keep a record at home of how you feel your condition is responding to treatment. You may want to share this information with your care team at each visit. You should contact your care team if your seizures get worse or if you have any new types of seizures. Do not stop taking this medication or any of your seizure medications unless instructed by your care team. Stopping your medicationsuddenly can increase your seizures or their severity. This medication may cause serious skin reactions. They can happen weeks to months after starting the   medication. Contact your care team right away if you notice fevers or flu-like symptoms with a rash. The rash may be red or purple and then turn into blisters or  peeling of the skin. Or, you might notice a red rash with swelling of the face, lips or lymph nodes in your neck or under yourarms. Wear a medical identification bracelet or chain if you are taking thismedication for seizures. Carry a card that lists all your medications. You may get drowsy, dizzy, or have blurred vision. Do not drive, use machinery, or do anything that needs mental alertness until you know how this medication affects you. To reduce dizzy or fainting spells, do not sit or stand up quickly, especially if you are an older patient. Alcohol can increasedrowsiness and dizziness. Your mouth may get dry. Chewing sugarless gum or sucking hard candy, anddrinking plenty of water may help. Watch for new or worsening thoughts of suicide or depression. This includes sudden changes in mood, behaviors, or thoughts. These changes can happen at any time but are more common in the beginning of treatment or after a change in dose. Call your care team right away if you experience these thoughts orworsening depression. If you become pregnant while using this medication, you may enroll in the North American Antiepileptic Drug Pregnancy Registry by calling 1-888-233-2334. This registry collects information about the safety of antiepileptic medication useduring pregnancy. What side effects may I notice from receiving this medication? Side effects that you should report to your care team as soon as possible: Allergic reactions or angioedema-skin rash, itching, hives, swelling of the face, eyes, lips, tongue, arms, or legs, trouble swallowing or breathing Rash, fever, and swollen lymph nodes Thoughts of suicide or self harm, worsening mood, feelings of depression Trouble breathing Unusual changes in mood or behavior in children after use such as difficulty concentrating, hostility, or restlessness Side effects that usually do not require medical attention (report to your careteam if they continue or are  bothersome): Dizziness Drowsiness Nausea Swelling of ankles, feet, or hands Vomiting This list may not describe all possible side effects. Call your doctor for medical advice about side effects. You may report side effects to FDA at1-800-FDA-1088. Where should I keep my medication? Keep out of reach of children and pets. Store at room temperature between 15 and 30 degrees C (59 and 86 degrees F).Get rid of any unused medication after the expiration date. This medication may cause accidental overdose and death if taken by other adults, children, or pets. Mix any unused medication with a substance like cat litter or coffee grounds. Then throw the medication away in a sealed containerlike a sealed bag or a coffee can with a lid. NOTE: This sheet is a summary. It may not cover all possible information. If you have questions about this medicine, talk to your doctor, pharmacist, orhealth care provider.  2022 Elsevier/Gold Standard (2020-06-23 12:16:18)  

## 2021-04-06 NOTE — Progress Notes (Signed)
[facility]INE [facility]    Provider:  Melvyn Novas, MD  Primary Care Physician:  Clayborn Heron, MD [address] [place] [address]     Referring Provider: Clayborn Heron, Md [address] [place],  [place] [address]          Chief Complaint according to patient   Patient presents with:                HISTORY OF PRESENT ILLNESS:   Debbie Stevens is a 71 y.o. year old [place] female patient seen here with interpreter-as a referral on 04/06/2021  for a headache evaluation/ tension headache, cervicalgia - with some symptoms RM 11 w interpreter. Last seen 03/16/21. Pt is here to discuss other treatment options, she states  the nerve block has helped.  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 Elavil has helped her sleep. She has no headaches in the night or morning , onset in PM Evening , sometimes lasting into 10 PM.  She feels physical activity causes more neck pain.  Nabumetone is apparently not helping. She reports fairly normal blood pressure.      11-04-2020: RV She has been seen in consultation on 12-25-2019, and follow up with NP on 04-01-2020-  The patient reports that the initial treatment with amitriptyline was successful at 25 mg at night but after about 4 or 5 months of improvement the medication seem to have worn off.  She describes similar tension type headaches that involve the occipital area and back of the neck and that these have returned.   She states that the trigger for those headaches was lifting something heavy or pushing something heavy-  that may be indicative of aValsalva component.  There is no nausea and no photophobia, and not dependent on rotatory ROM in the neck.  She has reported the pain can get worse when flexing or retro-flexing the neck  She takes now nabumetone 500 mg prn , but continues Elavil 25 mg each night.     Chief concern according to patient :   Debbie Stevens has suffered from headache in the crown and back of the head for  20 some years. She feels fine in AM- the headaches progress usually over the afternoon and become more severe towards the evening. There is no associated nausea, no photophobia.  The neck is tense and hurts between the shoulder blades when she hovers over her workspace. She is a Futures trader.    I have the pleasure of seeing Debbie Stevens today, a right-handed Asian female with a chronic headache. She  has a past medical history of GERD (gastroesophageal reflux disease) and Hypertension. She has been seen at [facility] may be 10 years ago. She reports having been prescribed NABUMETONE- No family history of headaches.   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 .  Living with daughter and grandchildren. Pets are present. 2 dogs.  Tobacco use: never .   ETOH use; never , Caffeine intake in form of Coffee( used to drink 2 a day)      Review of Systems: Out of a complete 14 system review, the patient complains of only the following symptoms, and all other reviewed systems are negative.:  Headaches - tension type, occipital nerve pain- decreased frequency, but nabumetone is taken about 2-3 /month.  Non migrainous.   Social History  Socioeconomic History   Marital status: Divorced    Spouse name: Not on file   Number of children: Not on file   Years of education: Not on file   Highest education level: Not on file  Occupational History   Not on file  Tobacco Use   Smoking status: Never   Smokeless tobacco: Never  Vaping Use   Vaping Use: Never used  Substance and Sexual Activity   Alcohol use: No   Drug use: No   Sexual activity: Not on file  Other Topics Concern   Not on file  Social History Narrative   Not on file   Social Determinants of Health   Financial Resource Strain: Not on file  Food Insecurity: Not on file  Transportation Needs: Not on file  Physical Activity: Not on  file  Stress: Not on file  Social Connections: Not on file    Past Medical History:  Diagnosis Date   GERD (gastroesophageal reflux disease)    Hypertension     Past Surgical History:  Procedure Laterality Date   BILATERAL OOPHORECTOMY     KIDNEY SURGERY     right kidney removal.      Current Outpatient Medications on File Prior to Visit  Medication Sig Dispense Refill   acetaminophen (TYLENOL) 650 MG CR tablet Take 650 mg by mouth every 8 (eight) hours as needed for pain.     amitriptyline (ELAVIL) 50 MG tablet Take 1 tablet (50 mg total) by mouth at bedtime. 90 tablet 1   Boswellia-Glucosamine-Vit D (GLUCOSAMINE COMPLEX PO) Take 1 tablet by mouth daily. Muscle/joint pain     Cholecalciferol 25 MCG (1000 UT) capsule Take by mouth.     citalopram (CELEXA) 40 MG tablet Take 40 mg by mouth daily.     doxazosin (CARDURA) 2 MG tablet Take 2 mg by mouth 2 (two) times daily.     esomeprazole (NEXIUM) 40 MG capsule      hydrOXYzine (ATARAX) 25 MG tablet Take 25 mg by mouth 3 (three) times daily as needed.     metoprolol tartrate (LOPRESSOR) 25 MG tablet Take 37.5 mg by mouth 2 (two) times daily.     nabumetone (RELAFEN) 500 MG tablet TAKE 1 TABLET BY MOUTH ONCE DAILY AS NEEDED 30 tablet 0   Omega-3 Fatty Acids (FISH OIL) 1000 MG CAPS Take 1,000 mg by mouth daily.     pravastatin (PRAVACHOL) 40 MG tablet Take 40 mg by mouth daily.     sucralfate (CARAFATE) 1 g tablet Take 1 tablet (1 g total) by mouth 4 (four) times daily -  with meals and at bedtime. 60 tablet 0   triamterene-hydrochlorothiazide (MAXZIDE-25) 37.5-25 MG tablet Take 1 tablet by mouth daily.     No current facility-administered medications on file prior to visit.    Allergies  Allergen Reactions   Amlodipine Other (See Comments)    Flushing   Metoprolol Palpitations    Physical exam:  Today's Vitals   04/06/21 1258  BP: (!) 141/93  Pulse: 84  Height: 5' (1.524 m)   Body mass index is 26.76 kg/m.   Wt  Readings from Last 3 Encounters:  11/04/20 137 lb (62.1 kg)  04/01/20 135 lb (61.2 kg)  12/25/19 128 lb (58.1 kg)     Ht Readings from Last 3 Encounters:  04/06/21 5' (1.524 m)  11/04/20 5' (1.524 m)  04/01/20 5' (1.524 m)      General: The patient is awake, alert and appears not in  acute distress. The patient is well groomed. Head: Normocephalic, atraumatic. Neck is supple. Mallampati 2  neck circumference:14 inches . Nasal airflow  patent.  Retrognathia is  seen.   Cardiovascular:  Regular rate and cardiac rhythm by pulse,  without distended neck veins. Respiratory: Lungs are clear to auscultation.  Skin:  Without evidence of ankle edema, or rash. Trunk: The patient's posture is erect.   Neurologic exam : The patient is awake and alert, oriented to place and time.   Memory subjective described as intact.  Attention span & concentration ability appears normal.  Speech is fluent, according to her translator. .  Mood and affect are appropriate.   Cranial nerves: no loss of smell or taste reported  Pupils are equal and briskly reactive to light. Funduscopic exam deferred.   Extraocular movements in vertical and horizontal planes were intact and without nystagmus. No Diplopia. Visual fields by finger perimetry are intact. Hearing was impaired to soft voice and some tinnitus.   Facial motor strength is symmetric and her tongue and uvula move midline.  Neck ROM :  Crackling and pain rotation,    TENSION radiating al the way up to occipital region. and flexion/ extension remain restricted , the shoulder shrug was not symmetrical in her last visit and is now better- less shoulder and neck pain.  Right shoulder shrug is lower and ROM restricted.    Motor exam:  Symmetric bulk, tone and ROM.   Normal tone without cog- wheeling, symmetric grip strength .   Sensory:  Fine touch vibration were normal.   Coordination:  The Finger-to-nose maneuver was intact without evidence of ataxia,  dysmetria or tremor.   Gait and station: Patient could rise unassisted from a seated position, walked without assistive device.  Stance is of normal width/ base and the patient turned with 3 steps.   Deep tendon reflexes: in the  upper and lower extremities are symmetric and intact.  Babinski response was deferred .      After spending a total time of 25  minutes face to face including additional time for physical and neurologic examination, review of laboratory studies,( needing interpreter service time) personal review of imaging studies, reports and results of other testing and review of referral information / records as far as provided in visit, I have established the following assessments:  1)  Classic tension headache- cervicalgia, occipital tension all the way to shoulder. Has decreased significantly. Increased ROM for neck and shoulders.     My Plan is to proceed with: at night / late afternoon muscle relaxants using  Amitriptyline , now at 50 mg nightly from 25 mg.  Refill of NABUMETONE for breakthrough pain prn also in daytime. Take celexa in AM - at least 10 hours apart from amitriptyline.  2) this is still a possible neuralgia. Dull and burning, not pounding, not stabbing, not electric  shock like. she still has a headache at the top of the head -burning. She feels ice packs help.   I offered the patient to return for trigger-point injection at the occipital region if the pain is not controlled, these can be performed by NP or MD.   She has had some good relief  from Trigger-point with Shawnie Dapper, NP. She felt a more than 50% reduction , peak on day 3 post injection and lasting about 2 -3 weeks.   I invite her to return as needed.  For the burning pain I want her to use ice-packs and prn treatment  with gabapentin is a possible choice. We agreed on 100 mg after lunch, po/  I plan to follow up either personally or through our NP within 6 month. I offered the patient to return for  trigger-point injection at the occipital region if the pain is not controlled, these can be performed by NP or MD.  She will be able to call and schedule.   CC: I will share my notes with PCP.   Electronically signed by: Melvyn Novas, MD 04/06/2021 2:00 PM  [facility] Sleep Board certified by The [employer] of Sleep Medicine and Diplomate of the [employer] of Sleep Medicine. Board certified In Neurology through the ABPN, Fellow of the [employer] of Neu[employer]. Medical Director of [facility].

## 2021-06-17 DIAGNOSIS — H2513 Age-related nuclear cataract, bilateral: Secondary | ICD-10-CM | POA: Diagnosis not present

## 2021-06-17 DIAGNOSIS — H25013 Cortical age-related cataract, bilateral: Secondary | ICD-10-CM | POA: Diagnosis not present

## 2021-06-17 DIAGNOSIS — H18413 Arcus senilis, bilateral: Secondary | ICD-10-CM | POA: Diagnosis not present

## 2021-06-17 DIAGNOSIS — H25043 Posterior subcapsular polar age-related cataract, bilateral: Secondary | ICD-10-CM | POA: Diagnosis not present

## 2021-06-17 DIAGNOSIS — H2511 Age-related nuclear cataract, right eye: Secondary | ICD-10-CM | POA: Diagnosis not present

## 2021-06-23 ENCOUNTER — Ambulatory Visit: Payer: Medicare Other | Admitting: Neurology

## 2021-06-28 DIAGNOSIS — F411 Generalized anxiety disorder: Secondary | ICD-10-CM | POA: Diagnosis not present

## 2021-06-28 DIAGNOSIS — Z23 Encounter for immunization: Secondary | ICD-10-CM | POA: Diagnosis not present

## 2021-06-28 DIAGNOSIS — I10 Essential (primary) hypertension: Secondary | ICD-10-CM | POA: Diagnosis not present

## 2021-06-28 DIAGNOSIS — G44229 Chronic tension-type headache, not intractable: Secondary | ICD-10-CM | POA: Diagnosis not present

## 2021-06-28 DIAGNOSIS — E7800 Pure hypercholesterolemia, unspecified: Secondary | ICD-10-CM | POA: Diagnosis not present

## 2021-06-28 DIAGNOSIS — R7303 Prediabetes: Secondary | ICD-10-CM | POA: Diagnosis not present

## 2021-07-02 ENCOUNTER — Other Ambulatory Visit: Payer: Self-pay | Admitting: Neurology

## 2021-07-10 ENCOUNTER — Other Ambulatory Visit: Payer: Self-pay | Admitting: Neurology

## 2021-07-31 ENCOUNTER — Encounter (HOSPITAL_BASED_OUTPATIENT_CLINIC_OR_DEPARTMENT_OTHER): Payer: Self-pay | Admitting: *Deleted

## 2021-07-31 ENCOUNTER — Emergency Department (HOSPITAL_BASED_OUTPATIENT_CLINIC_OR_DEPARTMENT_OTHER)
Admission: EM | Admit: 2021-07-31 | Discharge: 2021-07-31 | Disposition: A | Discharge status: Home / Self Care | Payer: Medicare Other | Attending: Emergency Medicine | Admitting: Emergency Medicine

## 2021-07-31 ENCOUNTER — Other Ambulatory Visit: Payer: Self-pay

## 2021-07-31 ENCOUNTER — Emergency Department (HOSPITAL_BASED_OUTPATIENT_CLINIC_OR_DEPARTMENT_OTHER): Payer: Medicare Other

## 2021-07-31 DIAGNOSIS — R079 Chest pain, unspecified: Secondary | ICD-10-CM | POA: Diagnosis not present

## 2021-07-31 DIAGNOSIS — R002 Palpitations: Secondary | ICD-10-CM | POA: Insufficient documentation

## 2021-07-31 DIAGNOSIS — R072 Precordial pain: Secondary | ICD-10-CM | POA: Insufficient documentation

## 2021-07-31 DIAGNOSIS — I10 Essential (primary) hypertension: Secondary | ICD-10-CM | POA: Diagnosis not present

## 2021-07-31 DIAGNOSIS — Z20822 Contact with and (suspected) exposure to covid-19: Secondary | ICD-10-CM | POA: Insufficient documentation

## 2021-07-31 DIAGNOSIS — R0981 Nasal congestion: Secondary | ICD-10-CM | POA: Insufficient documentation

## 2021-07-31 DIAGNOSIS — R059 Cough, unspecified: Secondary | ICD-10-CM | POA: Insufficient documentation

## 2021-07-31 LAB — CBC
HCT: 37.4 % (ref 36.0–46.0)
Hemoglobin: 12.8 g/dL (ref 12.0–15.0)
MCH: 28.9 pg (ref 26.0–34.0)
MCHC: 34.2 g/dL (ref 30.0–36.0)
MCV: 84.4 fL (ref 80.0–100.0)
Platelets: 201 10*3/uL (ref 150–400)
RBC: 4.43 MIL/uL (ref 3.87–5.11)
RDW: 11.9 % (ref 11.5–15.5)
WBC: 4.9 10*3/uL (ref 4.0–10.5)
nRBC: 0 % (ref 0.0–0.2)

## 2021-07-31 LAB — BASIC METABOLIC PANEL
Anion gap: 9 (ref 5–15)
BUN: 26 mg/dL — ABNORMAL HIGH (ref 8–23)
CO2: 25 mmol/L (ref 22–32)
Calcium: 9.8 mg/dL (ref 8.9–10.3)
Chloride: 100 mmol/L (ref 98–111)
Creatinine, Ser: 1.29 mg/dL — ABNORMAL HIGH (ref 0.44–1.00)
GFR, Estimated: 44 mL/min — ABNORMAL LOW (ref >60)
Glucose, Bld: 110 mg/dL — ABNORMAL HIGH (ref 70–99)
Potassium: 3.4 mmol/L — ABNORMAL LOW (ref 3.5–5.1)
Sodium: 134 mmol/L — ABNORMAL LOW (ref 135–145)

## 2021-07-31 LAB — RESP PANEL BY RT-PCR (FLU A&B, COVID) ARPGX2
Influenza A by PCR: NEGATIVE
Influenza B by PCR: NEGATIVE
SARS Coronavirus 2 by RT PCR: NEGATIVE

## 2021-07-31 LAB — TROPONIN I (HIGH SENSITIVITY): Troponin I (High Sensitivity): 2 ng/L (ref <18)

## 2021-07-31 NOTE — ED Triage Notes (Signed)
Intermittent chest pressure since yesterday. States she has been sick with a cold. Pt concerned about blood pressure. Pt triaged with family assisting with interpretation

## 2021-07-31 NOTE — ED Notes (Signed)
Pt not in room, pt in xray.  

## 2021-07-31 NOTE — ED Provider Notes (Signed)
Emergency Department Provider Note   I have reviewed the triage vital signs and the nursing notes.   HISTORY  Chief Complaint Chest Pain   HPI Debbie Stevens is a 70 y.o. female with past medical history of GERD and hypertension presents to the emergency department with her daughter at bedside complaining of chest pain over the past 24 hours.  The patient's daughter is assisting with interpretation, per patient request.  The patient describes a central chest discomfort which is worse after she takes her Coricidin cold and flu medicine.  She is had some congestion and cough over the past several days.  She notices an association with taking this medication and then developed some mild palpitations with discomfort.  This tends to wax and wane.  No exertional or pleuritic symptoms.  No fever.  No vomiting or diarrhea.  No other modifying factors.  No prior history of ACS or PE.   Past Medical History:  Diagnosis Date   GERD (gastroesophageal reflux disease)    Hypertension     Patient Active Problem List   Diagnosis Date Noted   Cervicalgia of occipito-atlanto-axial region 04/06/2021   Chronic tension-type headache, intractable 12/25/2019    Past Surgical History:  Procedure Laterality Date   BILATERAL OOPHORECTOMY     KIDNEY SURGERY     right kidney removal.     Allergies Amlodipine and Metoprolol  No family history on file.  Social History Social History   Tobacco Use   Smoking status: Never   Smokeless tobacco: Never  Vaping Use   Vaping Use: Never used  Substance Use Topics   Alcohol use: No   Drug use: No    Review of Systems  Constitutional: No fever/chills Eyes: No visual changes. ENT: No sore throat. Cardiovascular: Positive chest pain. Respiratory: Denies shortness of breath. Gastrointestinal: No abdominal pain.  No nausea, no vomiting.  No diarrhea.  No constipation. Genitourinary: Negative for dysuria. Musculoskeletal: Negative for back  pain. Skin: Negative for rash. Neurological: Negative for headaches, focal weakness or numbness.  10-point ROS otherwise negative.  ____________________________________________   PHYSICAL EXAM:  VITAL SIGNS: ED Triage Vitals [07/31/21 1645]  Enc Vitals Group     BP (!) 174/98     Pulse Rate 85     Resp 16     Temp 98.3 F (36.8 C)     Temp src      SpO2 98 %   Constitutional: Alert and oriented. Well appearing and in no acute distress. Eyes: Conjunctivae are normal.  Head: Atraumatic. Nose: No congestion/rhinnorhea. Mouth/Throat: Mucous membranes are moist.   Neck: No stridor.   Cardiovascular: Normal rate, regular rhythm. Good peripheral circulation. Grossly normal heart sounds.   Respiratory: Normal respiratory effort.  No retractions. Lungs CTAB. Gastrointestinal: Soft and nontender. No distention.  Musculoskeletal: No lower extremity tenderness nor edema. No gross deformities of extremities. Neurologic:  Normal speech and language. No gross focal neurologic deficits are appreciated.  Skin:  Skin is warm, dry and intact. No rash noted.  ____________________________________________   LABS (all labs ordered are listed, but only abnormal results are displayed)  Labs Reviewed  BASIC METABOLIC PANEL - Abnormal; Notable for the following components:      Result Value   Sodium 134 (*)    Potassium 3.4 (*)    Glucose, Bld 110 (*)    BUN 26 (*)    Creatinine, Ser 1.29 (*)    GFR, Estimated 44 (*)    All other  components within normal limits  RESP PANEL BY RT-PCR (FLU A&B, COVID) ARPGX2  CBC  TROPONIN I (HIGH SENSITIVITY)   ____________________________________________  EKG   EKG Interpretation  Date/Time:  Sunday July 31 2021 16:54:48 EST Ventricular Rate:  83 PR Interval:  206 QRS Duration: 96 QT Interval:  397 QTC Calculation: 467 R Axis:   26 Text Interpretation: Sinus rhythm Confirmed by Alona Bene [phone]) on 07/31/2021 4:58:04 PM         ____________________________________________  RADIOLOGY  DG Chest 2 View  Result Date: 07/31/2021 CLINICAL DATA:  Chest pain EXAM: CHEST - 2 VIEW COMPARISON:  Chest x-ray dated August 20, 2018 FINDINGS: Cardiac and mediastinal contours are unchanged. Mild bibasilar opacities, likely due to atelectasis. No large pleural effusion or pneumothorax. IMPRESSION: No active cardiopulmonary disease. Electronically Signed   By: Allegra Lai M.D.   On: 07/31/2021 17:23    ____________________________________________   PROCEDURES  Procedure(s) performed:   Procedures  None ____________________________________________   INITIAL IMPRESSION / ASSESSMENT AND PLAN / ED COURSE  Pertinent labs & imaging results that were available during my care of the patient were reviewed by me and considered in my medical decision making (see chart for details).   Patient presents emergency department with intermittent chest discomfort.  Seems to be associated with over-the-counter cold/flu medications.  Plan for ACS rule out given the patient's age and history of hypertension.  Abdomen is diffusely soft and nontender.  Not consistent with gallbladder pathology.  Lower suspicion for gastritis.  Low suspicion for PE although considered. Low risk by HEART score.   Differential includes all life-threatening causes for chest pain. This includes but is not exclusive to acute coronary syndrome, aortic dissection, pulmonary embolism, cardiac tamponade, community-acquired pneumonia, pericarditis, musculoskeletal chest wall pain, etc.   Labs reviewed. COVID and flu are negative. Troponin negative. Baseline CKD with unremarkable CXR. Plan for d/c with close PCP follow up and ED return precautions.  ____________________________________________  FINAL CLINICAL IMPRESSION(S) / ED DIAGNOSES  Final diagnoses:  Precordial chest pain    Note:  This document was prepared using Dragon voice recognition software and  may include unintentional dictation errors.  Alona Bene, MD, [facility]    Jeena Arnett, Arlyss Repress, MD 08/04/21 [phone]

## 2021-07-31 NOTE — ED Notes (Signed)
Labs, xray, VS, EKG reviewed. Viral panels and troponin pending. Pt up top b/r, steady gait. Denies pain, sob, nausea, dizziness or other sx. "Feel normal". Daughter at [facility].

## 2021-07-31 NOTE — ED Notes (Signed)
ED Provider at bedside. 

## 2021-07-31 NOTE — Discharge Instructions (Signed)
Call your primary care doctor and your cardiology doctor tomorrow for a follow up appointment. Return with any new or worsening symptoms.

## 2021-08-02 DIAGNOSIS — I10 Essential (primary) hypertension: Secondary | ICD-10-CM | POA: Diagnosis not present

## 2021-08-11 ENCOUNTER — Encounter: Payer: Self-pay | Admitting: Adult Health

## 2021-08-11 ENCOUNTER — Ambulatory Visit (INDEPENDENT_AMBULATORY_CARE_PROVIDER_SITE_OTHER): Payer: Medicare Other | Admitting: Adult Health

## 2021-08-11 VITALS — BP 120/78 | HR 82 | Ht 59.0 in | Wt 130.0 lb

## 2021-08-11 DIAGNOSIS — M5481 Occipital neuralgia: Secondary | ICD-10-CM | POA: Diagnosis not present

## 2021-08-11 DIAGNOSIS — M542 Cervicalgia: Secondary | ICD-10-CM | POA: Diagnosis not present

## 2021-08-11 NOTE — Progress Notes (Signed)
PATIENT: Debbie Stevens DOB: 28-Apr-1951  REASON FOR VISIT: follow up HISTORY FROM: patient PRIMARY NEUROLOGIST: Dr. Vickey Huger  HISTORY OF PRESENT ILLNESS: Today 08/11/21  Debbie Stevens is a 70 year old female with a history of cervicalgia and occipital neuralgia.  She returns today for follow-up.  She reports that after her nerve blocks in July she had 5 months of relief.  She states in the last month her symptoms have returned.  She states it normally starts in the neck and radiates up the back of the head bilaterally.  She remains on amitriptyline, gabapentin and Relafen.  Patient states that she would prefer to have a nerve block versus increasing medication    REVIEW OF SYSTEMS: Out of a complete 14 system review of symptoms, the patient complains only of the following symptoms, and all other reviewed systems are negative.  ALLERGIES: Allergies  Allergen Reactions   Amlodipine Other (See Comments)    Flushing   Metoprolol Palpitations    HOME MEDICATIONS: Outpatient Medications Prior to Visit  Medication Sig Dispense Refill   amitriptyline (ELAVIL) 50 MG tablet TAKE 1 TABLET BY MOUTH AT BEDTIME 90 tablet 0   Boswellia-Glucosamine-Vit D (GLUCOSAMINE COMPLEX PO) Take 1 tablet by mouth daily. Muscle/joint pain     Cholecalciferol 25 MCG (1000 UT) capsule Take by mouth.     citalopram (CELEXA) 40 MG tablet Take 40 mg by mouth daily.     doxazosin (CARDURA) 2 MG tablet Take 2 mg by mouth 2 (two) times daily.     esomeprazole (NEXIUM) 40 MG capsule      gabapentin (NEURONTIN) 100 MG capsule Take 1 capsule (100 mg total) by mouth daily after lunch. 30 capsule 5   metoprolol tartrate (LOPRESSOR) 25 MG tablet Take 37.5 mg by mouth 2 (two) times daily.     Omega-3 Fatty Acids (FISH OIL) 1000 MG CAPS Take 1,000 mg by mouth daily.     pravastatin (PRAVACHOL) 40 MG tablet Take 40 mg by mouth daily.     sucralfate (CARAFATE) 1 g tablet Take 1 tablet (1 g total) by mouth 4 (four) times  daily -  with meals and at bedtime. 60 tablet 0   triamterene-hydrochlorothiazide (MAXZIDE-25) 37.5-25 MG tablet Take 1 tablet by mouth daily.     nabumetone (RELAFEN) 500 MG tablet TAKE 1 TABLET BY MOUTH ONCE DAILY AS NEEDED 30 tablet 0   acetaminophen (TYLENOL) 650 MG CR tablet Take 650 mg by mouth every 8 (eight) hours as needed for pain.     No facility-administered medications prior to visit.    PAST MEDICAL HISTORY: Past Medical History:  Diagnosis Date   GERD (gastroesophageal reflux disease)    Headache    Hypertension     PAST SURGICAL HISTORY: Past Surgical History:  Procedure Laterality Date   BILATERAL OOPHORECTOMY     KIDNEY SURGERY     right kidney removal.     FAMILY HISTORY: History reviewed. No pertinent family history.  SOCIAL HISTORY: Social History   Socioeconomic History   Marital status: Divorced    Spouse name: Not on file   Number of children: Not on file   Years of education: Not on file   Highest education level: Not on file  Occupational History   Not on file  Tobacco Use   Smoking status: Never   Smokeless tobacco: Never  Vaping Use   Vaping Use: Never used  Substance and Sexual Activity   Alcohol use: No   Drug use:  No   Sexual activity: Not on file  Other Topics Concern   Not on file  Social History Narrative   Not on file   Social Determinants of Health   Financial Resource Strain: Not on file  Food Insecurity: Not on file  Transportation Needs: Not on file  Physical Activity: Not on file  Stress: Not on file  Social Connections: Not on file  Intimate Partner Violence: Not on file      PHYSICAL EXAM  Vitals:   08/11/21 1036  BP: 120/78  Pulse: 82  Weight: 130 lb (59 kg)  Height: 4\' 11"  (1.499 m)   Body mass index is 26.26 kg/m.  Generalized: Well developed, in no acute distress   Neurological examination  Mentation: Alert oriented to time, place, history taking. Follows all commands speech and language  fluent Cranial nerve II-XII: Pupils were equal round reactive to light. Extraocular movements were full, visual field were full on confrontational test. Facial sensation and strength were normal. Uvula tongue midline. Head turning and shoulder shrug  were normal and symmetric. Motor: The motor testing reveals 5 over 5 strength of all 4 extremities. Good symmetric motor tone is noted throughout.  Sensory: Sensory testing is intact to soft touch on all 4 extremities. No evidence of extinction is noted.  Coordination: Cerebellar testing reveals good finger-nose-finger and heel-to-shin bilaterally.  Gait and station: Gait is normal. Reflexes: Deep tendon reflexes are symmetric and normal bilaterally.   DIAGNOSTIC DATA (LABS, IMAGING, TESTING) - I reviewed patient records, labs, notes, testing and imaging myself where available.  Lab Results  Component Value Date   WBC 4.9 07/31/2021   HGB 12.8 07/31/2021   HCT 37.4 07/31/2021   MCV 84.4 07/31/2021   PLT 201 07/31/2021      Component Value Date/Time   NA 134 (L) 07/31/2021 1742   K 3.4 (L) 07/31/2021 1742   CL 100 07/31/2021 1742   CO2 25 07/31/2021 1742   GLUCOSE 110 (H) 07/31/2021 1742   BUN 26 (H) 07/31/2021 1742   CREATININE 1.29 (H) 07/31/2021 1742   CALCIUM 9.8 07/31/2021 1742   PROT 7.7 12/13/2019 1602   ALBUMIN 4.4 12/13/2019 1602   AST 24 12/13/2019 1602   ALT 22 12/13/2019 1602   ALKPHOS 58 12/13/2019 1602   BILITOT 1.0 12/13/2019 1602   GFRNONAA 44 (L) 07/31/2021 1742   GFRAA 50 (L) 12/13/2019 1602       ASSESSMENT AND PLAN 70 y.o. year old female  has a past medical history of GERD (gastroesophageal reflux disease), Headache, and Hypertension. here with:  Occipital Neuralgia/ Cervicalgia  Patient reports that nerve blocks gave her 5 months of relief.  She is requesting that again today.  She will continue on amitriptyline 50 mg at bedtime, Relafen 500 mg once daily as needed and gabapentin 100 mg at  lunchtime.   Nerve Block for occiptal neuralgia   Bupivacaine 0.5% and Lidocaine 1:1 mixture was injected on the scalp bilaterally at several locations:   -On the occipital area of the head, 3 injections to bilateral occipital region of scalp, 0.5 cc per injection at the midpoint between the mastoid process and the occipital protuberance. 2 other injections were done one finger breadth from the initial injection, one at a 10 o'clock position and the other at a 2 o'clock position. Bilaterally   The patient tolerated the injections well, no complications of the procedure were noted. Injections were made with a 27-gauge needle.    61, MSN,  NP-C 08/11/2021, 10:43 AM [facility] [address], [address] [place], [place] [address] [phone]

## 2021-08-11 NOTE — Progress Notes (Signed)
Nerve block (w/o) steroid: Pt signed consent :yes   0.5% Bupivocaine 1.60mL LOT: [id number] EXP: 08/2024 NDC: 74142-395-32  2% Lidocaine 1.3  mL LOT: [id number] EXP: 11/2021 NDC: 68616-837-29

## 2021-08-17 DIAGNOSIS — I10 Essential (primary) hypertension: Secondary | ICD-10-CM | POA: Diagnosis not present

## 2021-08-26 DIAGNOSIS — H2512 Age-related nuclear cataract, left eye: Secondary | ICD-10-CM | POA: Diagnosis not present

## 2021-08-26 DIAGNOSIS — H2511 Age-related nuclear cataract, right eye: Secondary | ICD-10-CM | POA: Diagnosis not present

## 2021-09-05 DIAGNOSIS — Z1231 Encounter for screening mammogram for malignant neoplasm of breast: Secondary | ICD-10-CM | POA: Diagnosis not present

## 2021-09-19 ENCOUNTER — Other Ambulatory Visit: Payer: Self-pay | Admitting: Neurology

## 2021-09-23 DIAGNOSIS — H2512 Age-related nuclear cataract, left eye: Secondary | ICD-10-CM | POA: Diagnosis not present

## 2021-10-12 ENCOUNTER — Other Ambulatory Visit: Payer: Self-pay | Admitting: Neurology

## 2021-10-19 DIAGNOSIS — K219 Gastro-esophageal reflux disease without esophagitis: Secondary | ICD-10-CM | POA: Diagnosis not present

## 2021-12-07 ENCOUNTER — Telehealth: Payer: Self-pay | Admitting: Family Medicine

## 2021-12-07 DIAGNOSIS — R002 Palpitations: Secondary | ICD-10-CM | POA: Diagnosis not present

## 2021-12-07 DIAGNOSIS — I10 Essential (primary) hypertension: Secondary | ICD-10-CM | POA: Diagnosis not present

## 2021-12-07 NOTE — Telephone Encounter (Signed)
Called and spoke w/ daughter. Offered appt today with MM,NP at 3pm. She declined. She had appt herself she has to go to.  ?She will need to bring her mother. ? ?Daughter does not work. Can bring her anytime. Would like [place] to monitor for any cancellations and call if any comes open. ?

## 2021-12-07 NOTE — Telephone Encounter (Signed)
Patient has been placed on the wait list for both Dr. Vickey Huger and Aundra Millet NP. ?

## 2021-12-07 NOTE — Telephone Encounter (Signed)
Pt's daughter called stating that her mother is having the headaches again and is wanting to know if she can come in to receive the shots again or discuss other medications or treatments. Pt has been having constant headaches for the past two weeks. Please advise. ?

## 2021-12-23 DIAGNOSIS — R9431 Abnormal electrocardiogram [ECG] [EKG]: Secondary | ICD-10-CM | POA: Diagnosis not present

## 2021-12-23 DIAGNOSIS — R0789 Other chest pain: Secondary | ICD-10-CM | POA: Diagnosis not present

## 2021-12-23 DIAGNOSIS — I10 Essential (primary) hypertension: Secondary | ICD-10-CM | POA: Diagnosis not present

## 2021-12-23 DIAGNOSIS — E7849 Other hyperlipidemia: Secondary | ICD-10-CM | POA: Diagnosis not present

## 2021-12-27 DIAGNOSIS — G44229 Chronic tension-type headache, not intractable: Secondary | ICD-10-CM | POA: Diagnosis not present

## 2021-12-27 DIAGNOSIS — K219 Gastro-esophageal reflux disease without esophagitis: Secondary | ICD-10-CM | POA: Diagnosis not present

## 2021-12-27 DIAGNOSIS — E7800 Pure hypercholesterolemia, unspecified: Secondary | ICD-10-CM | POA: Diagnosis not present

## 2021-12-27 DIAGNOSIS — I10 Essential (primary) hypertension: Secondary | ICD-10-CM | POA: Diagnosis not present

## 2021-12-27 DIAGNOSIS — Z789 Other specified health status: Secondary | ICD-10-CM | POA: Diagnosis not present

## 2021-12-27 DIAGNOSIS — R7303 Prediabetes: Secondary | ICD-10-CM | POA: Diagnosis not present

## 2021-12-27 DIAGNOSIS — F411 Generalized anxiety disorder: Secondary | ICD-10-CM | POA: Diagnosis not present

## 2022-01-11 ENCOUNTER — Other Ambulatory Visit: Payer: Self-pay | Admitting: Physician Assistant

## 2022-01-11 DIAGNOSIS — Z1211 Encounter for screening for malignant neoplasm of colon: Secondary | ICD-10-CM | POA: Diagnosis not present

## 2022-01-11 DIAGNOSIS — R1310 Dysphagia, unspecified: Secondary | ICD-10-CM | POA: Diagnosis not present

## 2022-01-11 DIAGNOSIS — K219 Gastro-esophageal reflux disease without esophagitis: Secondary | ICD-10-CM | POA: Diagnosis not present

## 2022-01-11 DIAGNOSIS — K5900 Constipation, unspecified: Secondary | ICD-10-CM | POA: Diagnosis not present

## 2022-01-12 DIAGNOSIS — K31A15 Gastric intestinal metaplasia without dysplasia, involving multiple sites: Secondary | ICD-10-CM | POA: Diagnosis not present

## 2022-01-12 DIAGNOSIS — K219 Gastro-esophageal reflux disease without esophagitis: Secondary | ICD-10-CM | POA: Diagnosis not present

## 2022-01-12 DIAGNOSIS — R1310 Dysphagia, unspecified: Secondary | ICD-10-CM | POA: Diagnosis not present

## 2022-01-12 DIAGNOSIS — K31A19 Gastric intestinal metaplasia without dysplasia, unspecified site: Secondary | ICD-10-CM | POA: Diagnosis not present

## 2022-01-12 DIAGNOSIS — K2930 Chronic superficial gastritis without bleeding: Secondary | ICD-10-CM | POA: Diagnosis not present

## 2022-01-12 DIAGNOSIS — K3189 Other diseases of stomach and duodenum: Secondary | ICD-10-CM | POA: Diagnosis not present

## 2022-01-24 DIAGNOSIS — K31A19 Gastric intestinal metaplasia without dysplasia, unspecified site: Secondary | ICD-10-CM | POA: Diagnosis not present

## 2022-01-24 DIAGNOSIS — K2930 Chronic superficial gastritis without bleeding: Secondary | ICD-10-CM | POA: Diagnosis not present

## 2022-01-24 DIAGNOSIS — K219 Gastro-esophageal reflux disease without esophagitis: Secondary | ICD-10-CM | POA: Diagnosis not present

## 2022-02-07 DIAGNOSIS — Z23 Encounter for immunization: Secondary | ICD-10-CM | POA: Diagnosis not present

## 2022-03-02 ENCOUNTER — Telehealth: Payer: Self-pay | Admitting: Neurology

## 2022-03-02 ENCOUNTER — Encounter: Payer: Self-pay | Admitting: Neurology

## 2022-03-02 ENCOUNTER — Ambulatory Visit (INDEPENDENT_AMBULATORY_CARE_PROVIDER_SITE_OTHER): Payer: Medicare Other | Admitting: Neurology

## 2022-03-02 VITALS — BP 131/74 | HR 79 | Ht 59.0 in | Wt 126.0 lb

## 2022-03-02 DIAGNOSIS — K2970 Gastritis, unspecified, without bleeding: Secondary | ICD-10-CM | POA: Diagnosis not present

## 2022-03-02 DIAGNOSIS — M542 Cervicalgia: Secondary | ICD-10-CM

## 2022-03-02 DIAGNOSIS — K219 Gastro-esophageal reflux disease without esophagitis: Secondary | ICD-10-CM | POA: Diagnosis not present

## 2022-03-02 DIAGNOSIS — G44221 Chronic tension-type headache, intractable: Secondary | ICD-10-CM | POA: Diagnosis not present

## 2022-03-02 DIAGNOSIS — Z1211 Encounter for screening for malignant neoplasm of colon: Secondary | ICD-10-CM | POA: Diagnosis not present

## 2022-03-02 MED ORDER — ACETAMINOPHEN ER 650 MG PO TBCR: 650.0000 mg | EXTENDED_RELEASE_TABLET | Freq: Three times a day (TID) | ORAL | 1 refills | Status: AC | PRN

## 2022-03-02 NOTE — Progress Notes (Addendum)
Nerve block (w) steroid: Pt signed consent yes  0.5% Bupivocaine 1 mL LOT: [id number] EXP: 01/26 NDC: 22633-354-56  2% Lidocaine 1 mL LOT: [id number] EXP: 09/2022 NDC: 73428-768-11  80mg /mL Methylprednisolone Acetate  LOT: EXP: 05/2023 NDC:[address]-1

## 2022-03-02 NOTE — Patient Instructions (Addendum)
Occipital Neuralgia  After spending a total time of 25  minutes face to face including additional time for physical and neurologic examination, review of laboratory studies,( needing interpreter service time) personal review of imaging studies, reports and results of other testing and review of referral information / records as far as provided in visit, I have established the following assessments:   1)  Classic tension headache- cervicalgia, occipital tension all the way to shoulder    I offered the patient to return for trigger-point injection at the occipital region if the pain is not controlled, these can be performed by NP or MD.    Nerve block (w) steroid: Pt signed consent :yes    0.5% Bupivocaine 1.mL 2% Lidocaine 1.  mL Solumedrol 1 mL      I plan to follow up through our NP within 6- 8 months. I offered the patient to return for trigger-point injection at the occipital region if the pain is not controlled, these can be performed by NP or MD.    Occipital neuralgia is a type of headache that causes brief episodes of very bad pain in the back of the head. Pain from occipital neuralgia may spread (radiate) to other parts of the head. These headaches may be caused by irritation of the nerves that leave the spinal cord high up in the neck, just below the base of the skull (occipital nerves). The occipital nerves transmit sensations from the back of the head, the top of the head, and the areas behind the ears. What are the causes? This condition can occur without any known cause (primary headache syndrome). In other cases, this condition is caused by pressure on or irritation of one of the two occipital nerves. Pressure and irritation may be due to: Muscle spasm in the neck. Neck injury. Wear and tear of the vertebrae in the neck (osteoarthritis). Disease of the disks that separate the vertebrae. Swollen blood vessels that put pressure on the occipital  nerves. Infections. Tumors. Diabetes. What are the signs or symptoms? This condition causes brief burning, stabbing, electric, shocking, or shooting pain in the back of the head that can radiate to the top of the head. It can happen on one side or both sides of the head. It can also cause: Pain behind the eye. Pain triggered by neck movement or hair brushing. Scalp tenderness. Aching in the back of the head between episodes of very bad pain. Pain that gets worse with exposure to bright lights. How is this diagnosed? Your health care provider may diagnose the condition based on a physical exam and your symptoms. Tests may be done, such as: Imaging studies of the brain and neck (cervical spine), such as an MRI or CT scan. These look for causes of pinched nerves. Applying pressure to the nerves in the neck to try to re-create the pain. Injection of numbing medicine into the occipital nerve areas to see if pain goes away (diagnostic nerve block). How is this treated? Treatment for this condition may begin with simple measures, such as: Rest. Massage. Applying heat or cold to the area. Over-the-counter pain relievers. If these measures do not work, you may need other treatments, including: Medicines, such as: Prescription-strength anti-inflammatory medicines. Muscle relaxants. Anti-seizure medicines, which can relieve pain. Antidepressants, which can relieve pain. Injected medicines, such as medicines that numb the area (local anesthetic) and steroids. Pulsed radiofrequency ablation. This is when wires are implanted to deliver electrical impulses that block pain signals from the occipital nerve.  Surgery to relieve nerve pressure. Physical therapy. Follow these instructions at home: Managing pain     Avoid any activities that cause pain. Rest when you have an attack of pain. Try gentle massage to relieve pain. Try a different pillow or sleeping position. If directed, apply heat to  the affected area as often as told by your health care provider. Use the heat source that your health care provider recommends, such as a moist heat pack or a heating pad. Place a towel between your skin and the heat source. Leave the heat on for 20-30 minutes. Remove the heat if your skin turns bright red. This is especially important if you are unable to feel pain, heat, or cold. You have a greater risk of getting burned. If directed, put ice on the back of your head and neck area. To do this: Put ice in a plastic bag. Place a towel between your skin and the bag. Leave the ice on for 20 minutes, 2-3 times a day. Remove the ice if your skin turns bright red. This is very important. If you cannot feel pain, heat, or cold, you have a greater risk of damage to the area. General instructions Take over-the-counter and prescription medicines only as told by your health care provider. Avoid things that make your symptoms worse, such as bright lights. Try to stay active. Get regular exercise that does not cause pain. Ask your health care provider to suggest safe exercises for you. Work with a physical therapist to learn stretching exercises you can do at home. Practice good posture. Keep all follow-up visits. This is important. Contact a health care provider if: Your medicine is not working. You have new or worsening symptoms. Get help right away if: You have very bad head pain that does not go away. You have a sudden change in vision, balance, or speech. These symptoms may represent a serious problem that is an emergency. Do not wait to see if the symptoms will go away. Get medical help right away. Call your local emergency services (911 in the [place] Do not drive yourself to the hospital. Summary Occipital neuralgia is a type of headache that causes brief episodes of very bad pain in the back of the head. Pain from occipital neuralgia may spread (radiate) to other parts of the head. Treatment  for this condition includes rest, massage, and medicines. This information is not intended to replace advice given to you by your health care provider. Make sure you discuss any questions you have with your health care provider. Document Revised: 06/06/2020 Document Reviewed: 06/06/2020 Elsevier Patient Education  2023 [employer].

## 2022-03-02 NOTE — Telephone Encounter (Signed)
medicare NPR sent to GI  

## 2022-03-02 NOTE — Progress Notes (Signed)
Provider:  Melvyn Novas, MD  Primary Care Physician:  Clayborn Heron, MD [address] [place] [address]     Referring Provider: Clayborn Heron, Md [address] [place],  [place] [address]          Chief Complaint according to patient   Patient presents with:     New Patient (Initial Visit)           HISTORY OF PRESENT ILLNESS:  03-02-2022 Here to f/u for for increase in HA. No longer on relafen stopped by PCP and replaced hydrochlorothiazide. Headache are about the same since last ov. Pn radiates from the bottom of neck to the top of head. Tylenol arthritis has helped. Pt would like to get nerve block inj.    Debbie Stevens is a 71 y.o. year old [place] female patient seen here with interpreter-as a referral on 03/02/2022  for a headache evaluation. She has had 2 NP visits with Shawnie Dapper, NP- and Butch Penny, received triggerpoint injections for occipito- atlanto -axial Neuralgia/ cervicalgia.  She denies pain with movement, ROM in the neck, rotation, lateral tilt or flexion and extension.  The pain kicks in when she lifts something heavy, pushes or pulls.- She has not had MRI of the cervical spine/  Her current medications have only changed in terms that she is taking metoprolol now twice daily 2 pills in the morning and 2 pills in the evening, gabapentin is still 100 mg in the afternoon, she is taking doxazosin, citalopram vitamin D Colace cholecalciferol, amitriptyline at night 50 mg and her primary care physician asked her to change from Tylenol 650 mg every 8 hours to 8 Tylenol arthritis medication.  She is also on hydrochlorothiazide 25 mg.  She is no longer on the triamterene.  Also known as Maxide.  Nabumetone was discontinued by her primary care doctor ( Relafen).  Here for a new nerve block. Occipital Neuralgia/ Cervicalgia Nerve block (w) steroid: Pt signed consent :yes  0.5% Bupivocaine 1.mL 2% Lidocaine 1.  mL Solumedrol 1  mL  Patient reports that nerve blocks gave her 5 months of relief.  She is requesting that again today.  She will continue on amitriptyline 50 mg at bedtime, Relafen 500 mg once daily as needed and gabapentin 100 mg at lunchtime.   Nerve Block for Atlanto - Occipital Neuralgia /Cervicalgia   Bupivacaine 0.5% and Lidocaine 1:1 mixture was injected on the scalp bilaterally at several locations:   -On the occipital area of the head, 3 injections to bilateral occipital region of scalp, 0.5 cc per injection at the midpoint between the mastoid process and the occipital protuberance. 2 other injections were done one finger breadth from the initial injection, one at a 10 o'clock position and the other at a 2 o'clock position. Bilaterally   The patient tolerated the injections well, no complications of the procedure were noted. Injections were made with a 27-gauge needle.     11-04-2020: RV; She has been seen in consultation on 12-25-2019, and follow up with NP on 04-01-2020-  The patient reports that the initial treatment with amitriptyline was successful at 25 mg at night but after about 4 or 5 months of improvement the medication seem to have worn off.  She describes similar tension type headaches that involve the occipital area and back of the neck and that these have returned.   She states that the trigger for those headaches was  lifting something heavy or pushing something heavy-  that may be indicative of aValsalva component.  There is no nausea and no photophobia, and not dependent on rotatory ROM in the neck.  She has reported the pain can get worse when flexing or retro-flexing the neck  She takes now nabumetone 500 mg prn , but continues Elavil 25 mg each night.     Chief concern according to patient :   [name] has suffered from headache in the crown and back of the head for 20 some years. She feels fine in AM- the headaches progress usually over the afternoon and become more severe towards  the evening. There is no associated nausea, no photophobia.  The neck is tense and hurts between the shoulder blades when she hovers over her workspace. She is a Futures trader.    I have the pleasure of seeing Debbie Stevens today, a right-handed Asian female with a chronic headache. She  has a past medical history of GERD (gastroesophageal reflux disease), Headache, and Hypertension. She has been seen at [facility] may be 10 years ago. She reports having been prescribed NABUMETONE- No family history of headaches.    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 .  Living with daughter and grandchildren. Pets are present. 2 dogs.  Tobacco use: never .  ETOH use; never ,  Caffeine intake in form of Coffee( used to drink 2 a day.     Review of Systems: Out of a complete 14 system review, the patient complains of only the following symptoms, and all other reviewed systems are negative.:    Headaches - tension type, occipital nerve pain- decreased frequency, but nabumetone is taken about 2-3 /month.  Non migrainous.   Nerve block (w) steroid: Pt signed consent :yes   0.5% Bupivocaine 1.mL 2% Lidocaine 1.  mL Solumedrol 1 mL  Social History   Socioeconomic History   Marital status: Divorced    Spouse name: Not on file   Number of children: Not on file   Years of education: Not on file   Highest education level: Not on file  Occupational History   Not on file  Tobacco Use   Smoking status: Never   Smokeless tobacco: Never  Vaping Use   Vaping Use: Never used  Substance and Sexual Activity   Alcohol use: No   Drug use: No   Sexual activity: Not on file  Other Topics Concern   Not on file  Social History Narrative   Not on file   Social Determinants of Health   Financial Resource Strain: Not on file  Food Insecurity: Not on file  Transportation Needs: Not on file  Physical  Activity: Not on file  Stress: Not on file  Social Connections: Not on file    Past Medical History:  Diagnosis Date   GERD (gastroesophageal reflux disease)    Headache    Hypertension     Past Surgical History:  Procedure Laterality Date   BILATERAL OOPHORECTOMY     KIDNEY SURGERY     right kidney removal.      Current Outpatient Medications on File Prior to Visit  Medication Sig Dispense Refill   amitriptyline (ELAVIL) 50 MG tablet TAKE 1 TABLET BY MOUTH AT BEDTIME 90 tablet 3   Boswellia-Glucosamine-Vit D (GLUCOSAMINE COMPLEX PO) Take 1 tablet by mouth daily. Muscle/joint pain  Cholecalciferol 25 MCG (1000 UT) capsule Take by mouth.     citalopram (CELEXA) 40 MG tablet Take 40 mg by mouth daily.     doxazosin (CARDURA) 2 MG tablet Take 2 mg by mouth 2 (two) times daily.     esomeprazole (NEXIUM) 40 MG capsule      gabapentin (NEURONTIN) 100 MG capsule TAKE 1 CAPSULE BY MOUTH ONCE DAILY AFTER  LUNCH 30 capsule 11   metoprolol tartrate (LOPRESSOR) 25 MG tablet Take 37.5 mg by mouth 2 (two) times daily.     Omega-3 Fatty Acids (FISH OIL) 1000 MG CAPS Take 1,000 mg by mouth daily.     pravastatin (PRAVACHOL) 40 MG tablet Take 40 mg by mouth daily.     sucralfate (CARAFATE) 1 g tablet Take 1 tablet (1 g total) by mouth 4 (four) times daily -  with meals and at bedtime. 60 tablet 0   No current facility-administered medications on file prior to visit.    Allergies  Allergen Reactions   Amlodipine Other (See Comments)    Flushing   Metoprolol Palpitations    Physical exam:  Today's Vitals   03/02/22 1414  BP: 131/74  Pulse: 79  Weight: 126 lb (57.2 kg)  Height: 4\' 11"  (1.499 m)   Body mass index is 25.45 kg/m.   Wt Readings from Last 3 Encounters:  03/02/22 126 lb (57.2 kg)  08/11/21 130 lb (59 kg)  04/06/21 136 lb 8 oz (61.9 kg)     Ht Readings from Last 3 Encounters:  03/02/22 4\' 11"  (1.499 m)  08/11/21 4\' 11"  (1.499 m)  04/06/21 5' (1.524 m)       General: The patient is awake, alert and appears not in acute distress. The patient is well groomed. Head: Normocephalic, atraumatic. Neck is supple. Mallampati 2  neck circumference:14 inches . Nasal airflow  patent.  Retrognathia is  seen.   Cardiovascular:  Regular rate and cardiac rhythm by pulse,  without distended neck veins. Respiratory: Lungs are clear to auscultation.  Skin:  Without evidence of ankle edema, or rash. Trunk: The patient's posture is erect.   Neurologic exam : The patient is awake and alert, oriented to place and time.   Memory subjective described as intact.  Attention span & concentration ability appears normal.  Speech is fluent,  without  dysarthria, dysphonia or aphasia.  Mood and affect are appropriate.   Cranial nerves: no loss of smell or taste reported  Pupils are equal and briskly reactive to light. Funduscopic exam deferred.   Extraocular movements in vertical and horizontal planes were intact and without nystagmus. No Diplopia. Visual fields by finger perimetry are intact. Hearing was impaired to soft voice and some tinnitus.   Facial motor strength is symmetric and her tongue and uvula move midline.  Neck ROM :  Crackling and pain rotation,    TENSION radiating al the way up to occipital region. and flexion/ extension remain restricted , the shoulder shrug was not symmetrical.   Right shoulder shrug is lower and ROM restricted.    Motor exam:  Symmetric bulk, tone and ROM.   Normal tone without cog- wheeling, symmetric grip strength .   Sensory:  Fine touch, pinprick and vibration were tested  in both arms, hands and feet.  Coordination:  The Finger-to-nose maneuver was intact without evidence of ataxia, dysmetria or tremor.   Gait and station: Patient could rise unassisted from a seated position, walked without assistive device.  Stance is of normal  width/ base and the patient turned with 3 steps.   Deep tendon reflexes: in the  upper and  lower extremities are symmetric and intact.  Babinski response was deferred .      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   Nerve block (w) steroid: Pt signed consent :yes   0.5% Bupivocaine 1.mL 2% Lidocaine 1.  mL Solumedrol 1 mL    I plan to follow up through our NP within 6- 8 months. I offered the patient to return for trigger-point injection at the occipital region if the pain is not controlled, these can be performed by NP or MD.   She will be able to call and schedule.   CC: I will share my notes with PCP.   Tylenol to be taken as needed and only then!   I prescribed 90 pills, 650 mg po.   Electronically signed by: Melvyn Novas, MD 03/02/2022 2:47 PM  [facility] Board certified by The [employer] of Sleep Medicine and Diplomate of the [employer] of [employer] Medicine. Board certified In Neurology through the ABPN, Fellow of the [employer] of [employer]. Medical Director of [facility].

## 2022-03-17 ENCOUNTER — Ambulatory Visit
Admission: RE | Admit: 2022-03-17 | Discharge: 2022-03-17 | Disposition: A | Discharge status: Home / Self Care | Payer: Medicare Other | Source: Ambulatory Visit | Attending: Neurology | Admitting: Neurology

## 2022-03-17 DIAGNOSIS — M542 Cervicalgia: Secondary | ICD-10-CM

## 2022-03-17 MED ORDER — GADOBENATE DIMEGLUMINE 529 MG/ML IV SOLN
11.0000 mL | Freq: Once | INTRAVENOUS | Status: AC | PRN
  Administered 2022-03-17: 11 mL via INTRAVENOUS

## 2022-03-19 NOTE — Addendum Note (Signed)
Addended by: Melvyn Novas on: 03/19/2022 04:12 PM   Modules accepted: Orders

## 2022-03-21 ENCOUNTER — Telehealth: Payer: Self-pay | Admitting: *Deleted

## 2022-03-21 NOTE — Telephone Encounter (Signed)
Called daughter, Fernand Parkins (on [place]).  Relayed results per Dr. Oliva Bustard note. She verbalized understanding. Aware someone will reach out to get her mother scheduled for EMG/NCS. We will call back once we receive results to talk about next steps.   She would like to be called at [phone] to schedule EMG/NCS for mother.

## 2022-03-21 NOTE — Telephone Encounter (Signed)
-----   Message from Melvyn Novas, MD sent at 03/19/2022  4:12 PM EDT -----  C4-5 shows prominent disc osteophyte protrusion and facet hypertrophy resulting in mild right-sided foraminal narrowing and slight effacement of the ventral thecal sac but no definite compression.   C5-6 shows broad-based disc osteophyte protrusion resulting in again narrowing of the ventral thecal space and facet hypertrophy resulting in moderate right-sided and mild left-sided foraminal narrowing but no definite nerve root compression.  These findings can lead to nerve root impingement and NCV and EMG are recommended for clarification. I placed an order.

## 2022-03-30 ENCOUNTER — Telehealth: Payer: Self-pay | Admitting: Neurology

## 2022-03-30 NOTE — Telephone Encounter (Signed)
Sent order to [facility] [id number]

## 2022-04-20 IMAGING — CT CT ABD-PELV W/ CM
2 of 5 series · 17 of 46 positions shown, 19 images · IV contrast (Omnipaque)
Comparison: None.

CLINICAL DATA: 68-year-old female with acute abdominal and pelvic
pain and vomiting.

EXAM:
CT ABDOMEN AND PELVIS WITH CONTRAST
TECHNIQUE: Multidetector CT imaging of the abdomen and pelvis was performed
using the standard protocol following bolus administration of
intravenous contrast.
CONTRAST:  80mL OMNIPAQUE IOHEXOL 300 MG/ML  SOLN

[Series 2: axial st · axial · 0.81mm/px · z∈[-428,-62]mm · 14 of 83 slices shown, 16 images]
[im 5/83  soft-tissue]
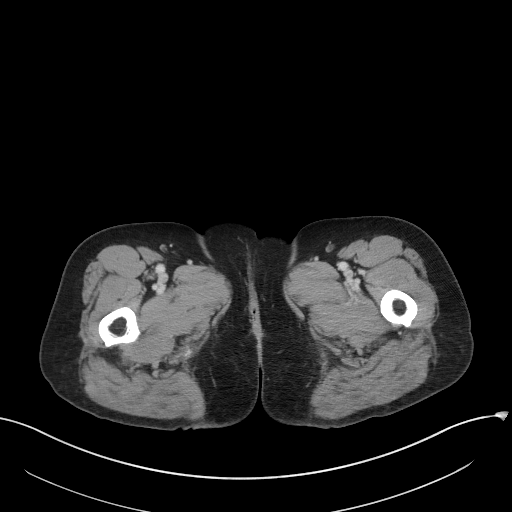
[im 5/83  bone]
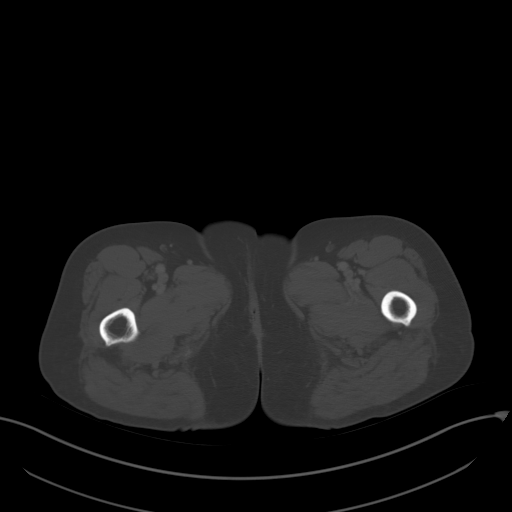
[im 9/83  soft-tissue]
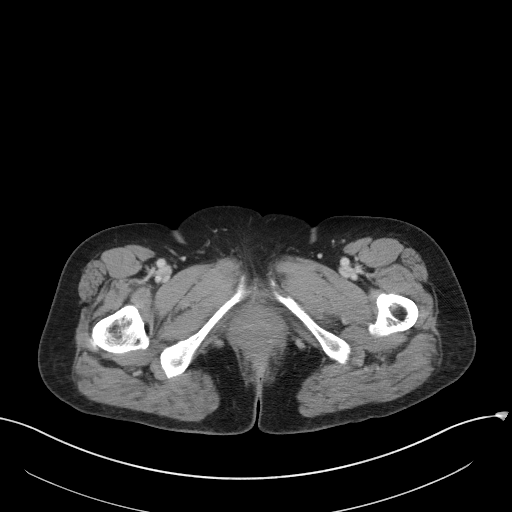
[im 18/83  soft-tissue]
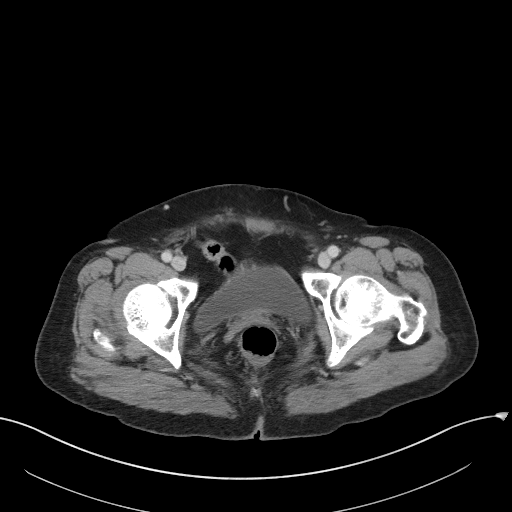
[im 22/83  soft-tissue]
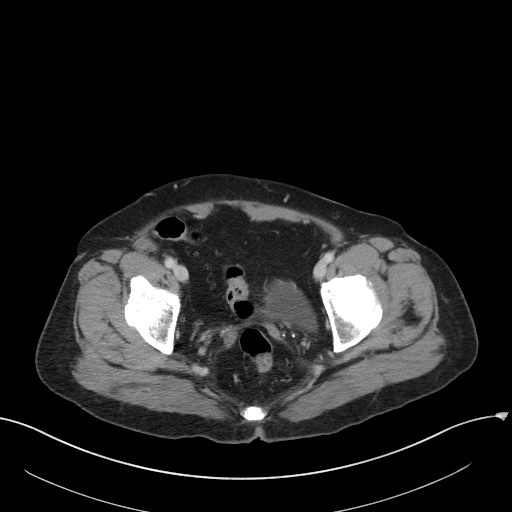
[im 26/83  soft-tissue]
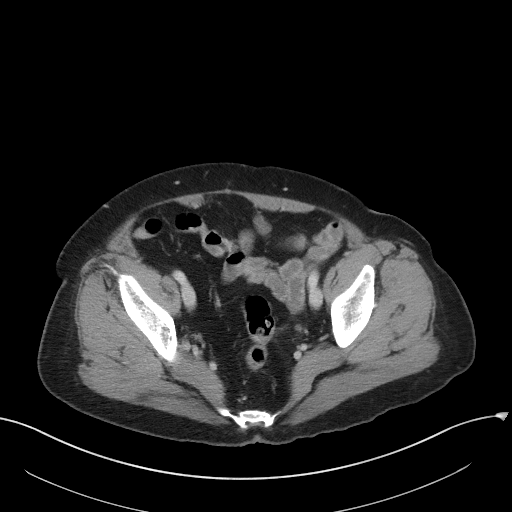
[im 35/83  soft-tissue]
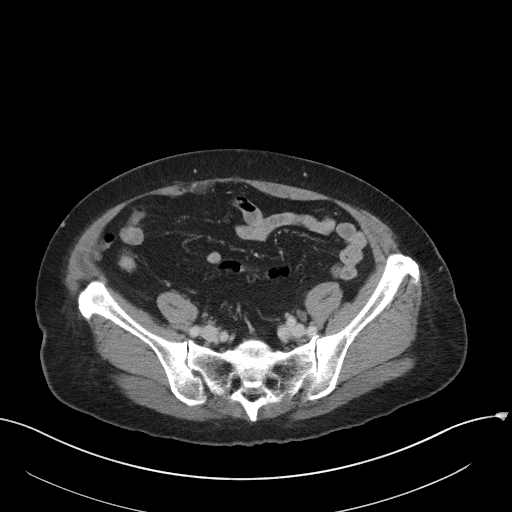
[im 39/83  soft-tissue]
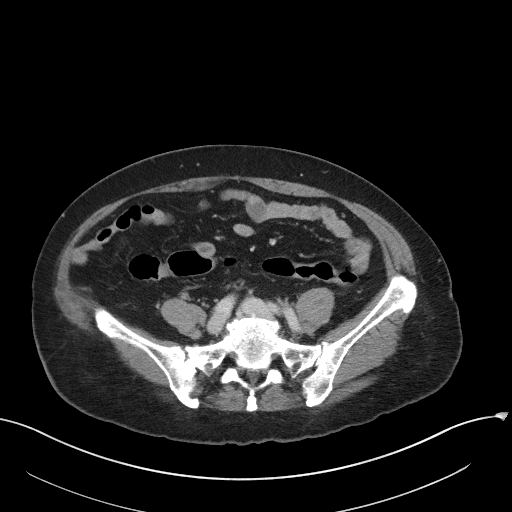
[im 44/83  soft-tissue]
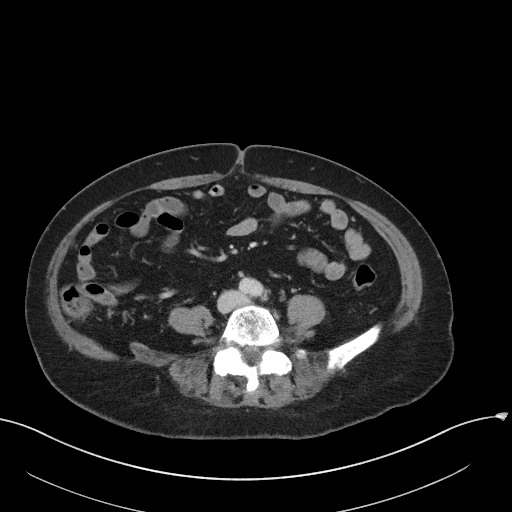
[im 48/83  soft-tissue]
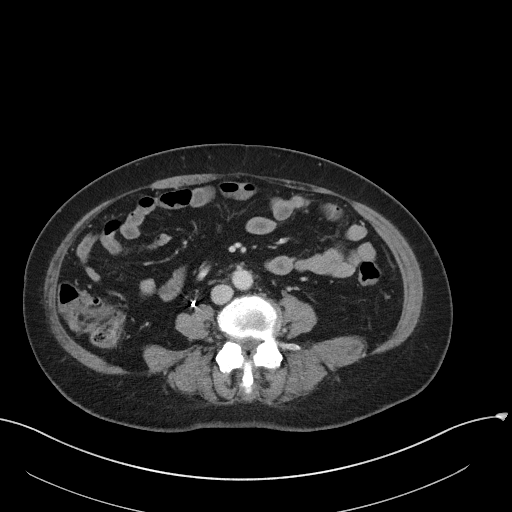
[im 48/83  bone]
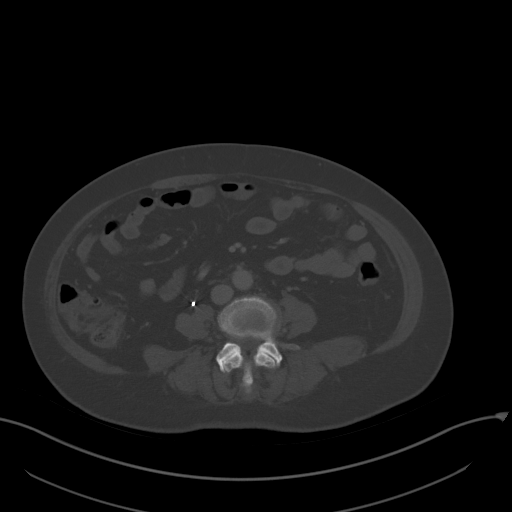
[im 57/83  soft-tissue]
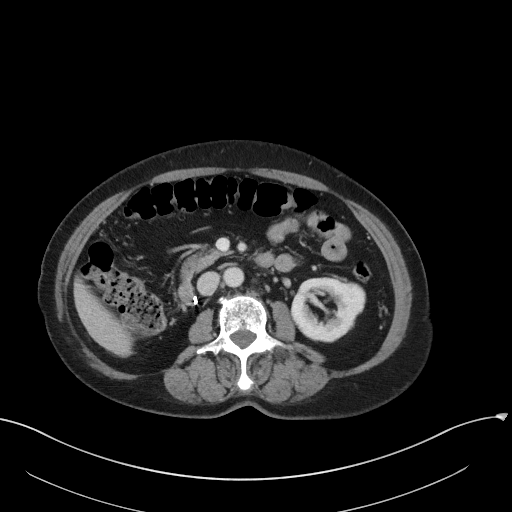
[im 61/83  soft-tissue]
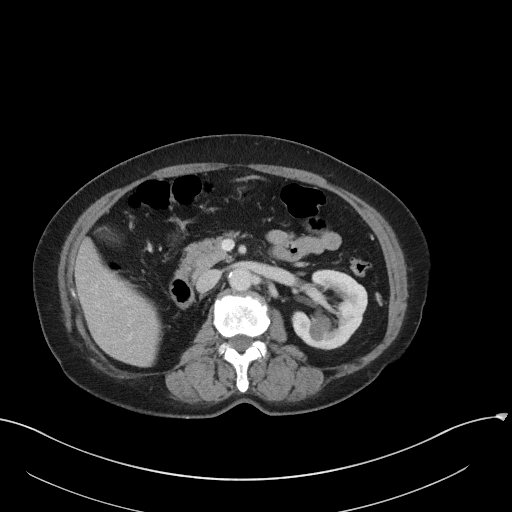
[im 65/83  soft-tissue]
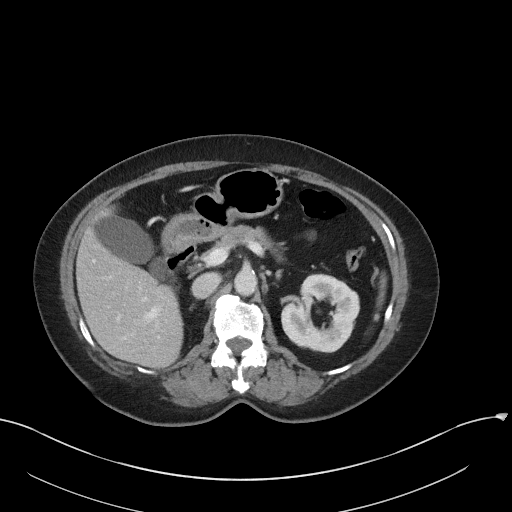
[im 74/83  soft-tissue]
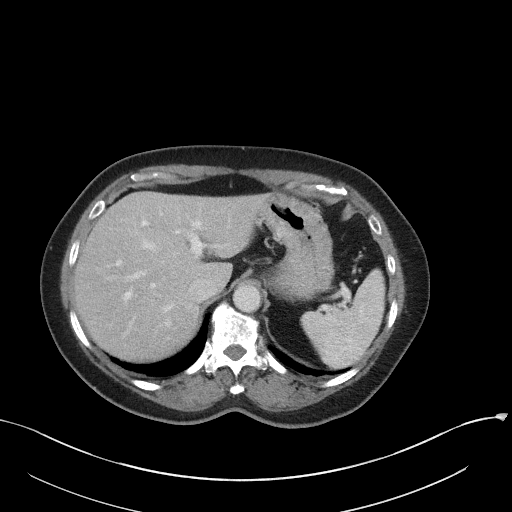
[im 78/83  soft-tissue]
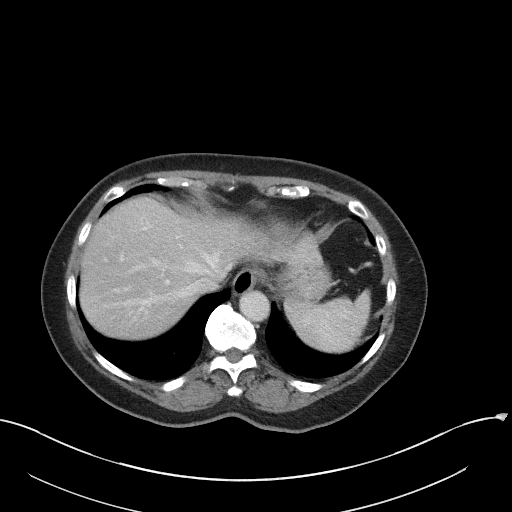

[Series 5: coronal st · coronal · 0.72mm/px · 3 of 86 slices shown]
[im 29/86  soft-tissue]
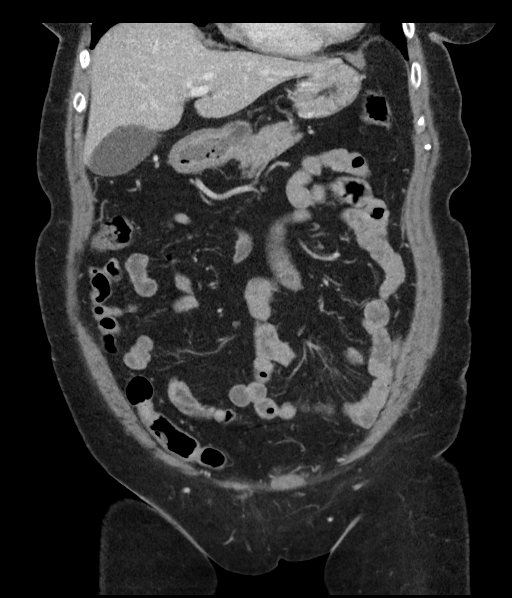
[im 38/86  soft-tissue]
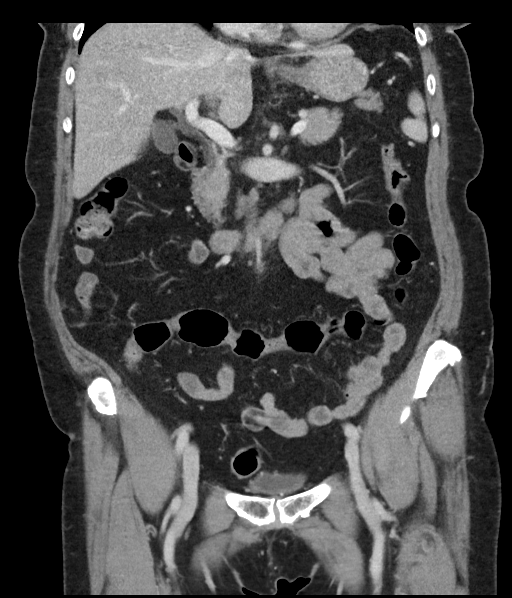
[im 48/86  soft-tissue]
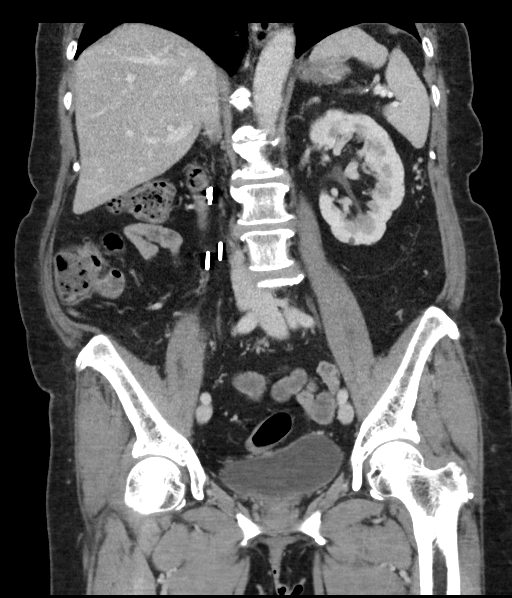

[17 of 46 positions shown; findings below may reference images not displayed]

FINDINGS: Lower chest: No acute abnormality.

Hepatobiliary: The liver and gallbladder are unremarkable. No
biliary dilatation.

Pancreas: Unremarkable

Spleen: Unremarkable

Adrenals/Urinary Tract: Patient is status post RIGHT nephrectomy.
LEFT renal cysts again noted. The adrenal glands and bladder are
unremarkable.

Stomach/Bowel: Stomach is within normal limits. Appendix appears
normal. No evidence of bowel wall thickening, distention, or
inflammatory changes.

Vascular/Lymphatic: Aortic atherosclerosis. No enlarged abdominal or
pelvic lymph nodes.

Reproductive: Status post hysterectomy. No adnexal masses.

Other: No ascites, focal collection/abscess or pneumoperitoneum.

Musculoskeletal: No acute or suspicious bony abnormalities.
Degenerative changes in the lumbar spine noted.
IMPRESSION: No evidence of acute abnormality.

Aortic Atherosclerosis ([id number].T).

## 2022-04-26 DIAGNOSIS — G5603 Carpal tunnel syndrome, bilateral upper limbs: Secondary | ICD-10-CM | POA: Diagnosis not present

## 2022-05-11 DIAGNOSIS — Z23 Encounter for immunization: Secondary | ICD-10-CM | POA: Diagnosis not present

## 2022-06-20 DIAGNOSIS — E7800 Pure hypercholesterolemia, unspecified: Secondary | ICD-10-CM | POA: Diagnosis not present

## 2022-06-20 DIAGNOSIS — Z6825 Body mass index (BMI) 25.0-25.9, adult: Secondary | ICD-10-CM | POA: Diagnosis not present

## 2022-06-20 DIAGNOSIS — I10 Essential (primary) hypertension: Secondary | ICD-10-CM | POA: Diagnosis not present

## 2022-06-20 DIAGNOSIS — I700 Atherosclerosis of aorta: Secondary | ICD-10-CM | POA: Diagnosis not present

## 2022-06-20 DIAGNOSIS — F411 Generalized anxiety disorder: Secondary | ICD-10-CM | POA: Diagnosis not present

## 2022-06-20 DIAGNOSIS — R7303 Prediabetes: Secondary | ICD-10-CM | POA: Diagnosis not present

## 2022-07-17 ENCOUNTER — Telehealth: Payer: Self-pay | Admitting: *Deleted

## 2022-07-17 NOTE — Telephone Encounter (Signed)
Took call from phone staff and spoke with daughter. Mother recently had f/u with [facility]. They recommended monitoring her for now. 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 They will continue to monitor sx and f.u if needed.

## 2022-07-17 NOTE — Telephone Encounter (Signed)
LVM for daughter, Fernand Parkins (on [place]) at [phone] to call office. Per Dr. Vickey Huger, EMG/NCS showed moderate right sided carpal tunnel syndrome. Recommends she f/u with [facility] for next steps/treatment options.

## 2022-07-27 ENCOUNTER — Encounter (HOSPITAL_BASED_OUTPATIENT_CLINIC_OR_DEPARTMENT_OTHER): Payer: Self-pay | Admitting: Urology

## 2022-07-27 ENCOUNTER — Other Ambulatory Visit: Payer: Self-pay

## 2022-07-27 ENCOUNTER — Emergency Department (HOSPITAL_BASED_OUTPATIENT_CLINIC_OR_DEPARTMENT_OTHER): Payer: Medicare Other

## 2022-07-27 ENCOUNTER — Emergency Department (HOSPITAL_BASED_OUTPATIENT_CLINIC_OR_DEPARTMENT_OTHER)
Admission: EM | Admit: 2022-07-27 | Discharge: 2022-07-27 | Disposition: A | Discharge status: Home / Self Care | Payer: Medicare Other | Attending: Emergency Medicine | Admitting: Emergency Medicine

## 2022-07-27 DIAGNOSIS — R079 Chest pain, unspecified: Secondary | ICD-10-CM | POA: Diagnosis not present

## 2022-07-27 DIAGNOSIS — Z79899 Other long term (current) drug therapy: Secondary | ICD-10-CM | POA: Diagnosis not present

## 2022-07-27 DIAGNOSIS — I10 Essential (primary) hypertension: Secondary | ICD-10-CM | POA: Insufficient documentation

## 2022-07-27 DIAGNOSIS — R1013 Epigastric pain: Secondary | ICD-10-CM | POA: Diagnosis not present

## 2022-07-27 LAB — BASIC METABOLIC PANEL
Anion gap: 9 (ref 5–15)
BUN: 25 mg/dL — ABNORMAL HIGH (ref 8–23)
CO2: 26 mmol/L (ref 22–32)
Calcium: 9.4 mg/dL (ref 8.9–10.3)
Chloride: 98 mmol/L (ref 98–111)
Creatinine, Ser: 1 mg/dL (ref 0.44–1.00)
GFR, Estimated: 59 mL/min — ABNORMAL LOW (ref >60)
Glucose, Bld: 117 mg/dL — ABNORMAL HIGH (ref 70–99)
Potassium: 3.4 mmol/L — ABNORMAL LOW (ref 3.5–5.1)
Sodium: 133 mmol/L — ABNORMAL LOW (ref 135–145)

## 2022-07-27 LAB — CBC
HCT: 40.2 % (ref 36.0–46.0)
Hemoglobin: 13.5 g/dL (ref 12.0–15.0)
MCH: 28.7 pg (ref 26.0–34.0)
MCHC: 33.6 g/dL (ref 30.0–36.0)
MCV: 85.4 fL (ref 80.0–100.0)
Platelets: 206 10*3/uL (ref 150–400)
RBC: 4.71 MIL/uL (ref 3.87–5.11)
RDW: 11.9 % (ref 11.5–15.5)
WBC: 6.8 10*3/uL (ref 4.0–10.5)
nRBC: 0 % (ref 0.0–0.2)

## 2022-07-27 LAB — TROPONIN I (HIGH SENSITIVITY): Troponin I (High Sensitivity): 3 ng/L (ref <18)

## 2022-07-27 NOTE — ED Triage Notes (Signed)
Per pt and family pt has had high blood pressure x 3-4 days  States heaviness in chest and feels like it is high but has not checked it  Denies N/V   Took bp meds PTA

## 2022-07-27 NOTE — ED Provider Notes (Signed)
[facility] EMERGENCY DEPARTMENT Provider Note   CSN: [id number] Arrival date & time: 07/27/22  1807     History  Chief Complaint  Patient presents with   Hypertension   Chest Pain    Debbie Stevens Debbie Stevens is a 71 y.o. female.  Patient is a 71 year old female with a past medical history of hypertension, chronic headaches and GERD presenting to the emergency department with high blood pressure.  Patient states that she has been following with her primary doctor regarding her blood pressure and has been checking her blood pressures daily at home.  She states that normally her blood pressure has been running in the 130s to 140s systolic however over the last 2 days her blood pressure has been running between the 160s to 180s systolic.  She denies any chest pain to me and clarifies that she just occasionally will get some epigastric pain after eating.  She states that this is the pain that she has had on and off for a long time and has been evaluated by her primary doctor.  She denies any shortness of breath.  She denies any new headaches or changes to her chronic headaches, numbness or weakness, nausea or vomiting.  She states she has been taking her medications as prescribed.  She states that she does have primary care follow-up tomorrow morning to discuss her blood pressure.  The history is provided by the patient and a relative. No language interpreter was used.  Hypertension Associated symptoms include chest pain.  Chest Pain      Home Medications Prior to Admission medications   Medication Sig Start Date End Date Taking? Authorizing Provider  acetaminophen (TYLENOL 8 HOUR ARTHRITIS PAIN) 650 MG CR tablet Take 1 tablet (650 mg total) by mouth every 8 (eight) hours as needed for pain. 03/02/22   Dohmeier, Porfirio Mylar, MD  amitriptyline (ELAVIL) 50 MG tablet TAKE 1 TABLET BY MOUTH AT BEDTIME 10/12/21   Butch Penny, NP  Boswellia-Glucosamine-Vit D (GLUCOSAMINE COMPLEX PO) Take 1  tablet by mouth daily. Muscle/joint pain    [provider]  Cholecalciferol 25 MCG (1000 UT) capsule Take by mouth.    [provider]  citalopram (CELEXA) 40 MG tablet Take 40 mg by mouth daily.    [provider]  doxazosin (CARDURA) 2 MG tablet Take 2 mg by mouth 2 (two) times daily.    [provider]  esomeprazole (NEXIUM) 40 MG capsule  12/12/19   [provider]  gabapentin (NEURONTIN) 100 MG capsule TAKE 1 CAPSULE BY MOUTH ONCE DAILY AFTER  LUNCH 09/20/21   Dohmeier, Porfirio Mylar, MD  metoprolol tartrate (LOPRESSOR) 25 MG tablet Take 75 mg by mouth 2 (two) times daily. 10/27/13   [provider]  Omega-3 Fatty Acids (FISH OIL) 1000 MG CAPS Take 1,000 mg by mouth daily.    [provider]  pravastatin (PRAVACHOL) 40 MG tablet Take 40 mg by mouth daily.    [provider]  sucralfate (CARAFATE) 1 g tablet Take 1 tablet (1 g total) by mouth 4 (four) times daily -  with meals and at bedtime. 12/13/19   Rolan Bucco, MD      Allergies    Amlodipine and Metoprolol    Review of Systems   Review of Systems  Cardiovascular:  Positive for chest pain.    Physical Exam Updated Vital Signs BP (!) 165/97 (BP Location: Right Arm)   Pulse 81   Temp 98.2 F (36.8 C) (Oral)   Resp  18   Ht 4\' 11"  (1.499 m)   Wt 57.2 kg   SpO2 96%   BMI 25.47 kg/m  Physical Exam Vitals and nursing note reviewed.  Constitutional:      General: She is not in acute distress.    Appearance: She is well-developed.  HENT:     Head: Normocephalic and atraumatic.  Eyes:     Extraocular Movements: Extraocular movements intact.  Cardiovascular:     Rate and Rhythm: Normal rate and regular rhythm.     Pulses:          Radial pulses are 2+ on the right side and 2+ on the left side.     Heart sounds: Normal heart sounds.  Pulmonary:     Effort: Pulmonary effort is normal.     Breath sounds: Normal breath sounds.  Abdominal:     Palpations:  Abdomen is soft.     Tenderness: There is no abdominal tenderness.  Musculoskeletal:        General: Normal range of motion.     Cervical back: Normal range of motion and neck supple.     Right lower leg: No edema.     Left lower leg: No edema.  Skin:    General: Skin is warm and dry.  Neurological:     General: No focal deficit present.     Mental Status: She is alert and oriented to person, place, and time.     Motor: No weakness.  Psychiatric:        Mood and Affect: Mood normal.        Behavior: Behavior normal.     ED Results / Procedures / Treatments   Labs (all labs ordered are listed, but only abnormal results are displayed) Labs Reviewed  BASIC METABOLIC PANEL - Abnormal; Notable for the following components:      Result Value   Sodium 133 (*)    Potassium 3.4 (*)    Glucose, Bld 117 (*)    BUN 25 (*)    GFR, Estimated 59 (*)    All other components within normal limits  CBC  TROPONIN I (HIGH SENSITIVITY)    EKG EKG Interpretation  Date/Time:  Thursday July 27 2022 18:36:30 EST Ventricular Rate:  78 PR Interval:  198 QRS Duration: 88 QT Interval:  392 QTC Calculation: 446 R Axis:   15 Text Interpretation: Normal sinus rhythm Normal ECG No significant change since last tracing Confirmed by Leanord Asal ([id number]) on 07/27/2022 8:22:22 PM  Radiology DG Chest 2 View  Result Date: 07/27/2022 CLINICAL DATA:  Chest pain EXAM: CHEST - 2 VIEW COMPARISON:  Chest x-ray 07/31/2021 FINDINGS: The heart size and mediastinal contours are within normal limits. Both lungs are clear. Degenerative changes affect the shoulders. There surgical clips in the upper abdomen. IMPRESSION: No active cardiopulmonary disease. Electronically Signed   By: Ronney Asters M.D.   On: 07/27/2022 18:59    Procedures Procedures    Medications Ordered in ED Medications - No data to display  ED Course/ Medical Decision Making/ A&P                           Medical Decision  Making This patient presents to the ED with chief complaint(s) of hypertension with pertinent past medical history of hypertension, GERD, chronic headaches which further complicates the presenting complaint. The complaint involves an extensive differential diagnosis and also carries with it a high risk of complications and  morbidity.    The differential diagnosis includes no signs of hypertensive emergency on exam, considering hypertensive urgency, atypical ACS  Additional history obtained: Additional history obtained from family Records reviewed Care Everywhere/External Records and Primary Care Documents  ED Course and Reassessment: Patient was initially evaluated by triage and had EKG, labs and chest x-ray performed.  EKG was nonischemic and initial troponin was negative.  She has been having high blood pressure for the past 2 days so single troponin is sufficient.  She has normal kidney function and no evidence of endorgan damage.  Her blood pressure has improved without any intervention.  The patient has close follow-up tomorrow with her primary care doctor and is stable for discharge home.  She was given strict return precautions.  Independent labs interpretation:  The following labs were independently interpreted: Within normal range  Independent visualization of imaging: - I independently visualized the following imaging with scope of interpretation limited to determining acute life threatening conditions related to emergency care: Chest x-ray, which revealed no acute disease  Consultation: - Consulted or discussed management/test interpretation w/ external professional: N/A  Consideration for admission or further workup: Patient has no emergent conditions requiring admission or further work-up at this time and is stable for discharge home with primary care follow-up  Social Determinants of health: N/A    Amount and/or Complexity of Data Reviewed Labs: ordered. Radiology:  ordered.          Final Clinical Impression(s) / ED Diagnoses Final diagnoses:  Hypertension, unspecified type    Rx / DC Orders ED Discharge Orders     None         Kemper Durie, DO 07/27/22 2045

## 2022-07-27 NOTE — Discharge Instructions (Signed)
You were seen in the emergency department for your high blood pressure.  You had no signs of any damage to your organs from her blood pressure being high and your blood pressure came down on its own while you are in the emergency department.  You should follow-up with your primary doctor tomorrow as planned to discuss your blood pressure.  You should continue to take your medications as prescribed in the meantime.  You should return to the emergency department if you develop chest pain, you have numbness or weakness on one side of the body compared to the other you have severe shortness of breath or if you have any other new or concerning symptoms.

## 2022-07-28 DIAGNOSIS — Z6824 Body mass index (BMI) 24.0-24.9, adult: Secondary | ICD-10-CM | POA: Diagnosis not present

## 2022-07-28 DIAGNOSIS — Z1389 Encounter for screening for other disorder: Secondary | ICD-10-CM | POA: Diagnosis not present

## 2022-07-28 DIAGNOSIS — Z0000 Encounter for general adult medical examination without abnormal findings: Secondary | ICD-10-CM | POA: Diagnosis not present

## 2022-09-06 DIAGNOSIS — Z1231 Encounter for screening mammogram for malignant neoplasm of breast: Secondary | ICD-10-CM | POA: Diagnosis not present

## 2022-09-11 ENCOUNTER — Encounter: Payer: Self-pay | Admitting: Adult Health

## 2022-09-11 ENCOUNTER — Ambulatory Visit: Payer: Medicare Other | Admitting: Family Medicine

## 2022-09-11 ENCOUNTER — Ambulatory Visit (INDEPENDENT_AMBULATORY_CARE_PROVIDER_SITE_OTHER): Payer: Medicare Other | Admitting: Adult Health

## 2022-09-11 VITALS — BP 138/81 | HR 69 | Ht 62.0 in | Wt 128.0 lb

## 2022-09-11 DIAGNOSIS — G44221 Chronic tension-type headache, intractable: Secondary | ICD-10-CM | POA: Diagnosis not present

## 2022-09-11 DIAGNOSIS — M5481 Occipital neuralgia: Secondary | ICD-10-CM | POA: Diagnosis not present

## 2022-09-11 MED ORDER — GABAPENTIN 100 MG PO CAPS: 100.0000 mg | ORAL_CAPSULE | Freq: Two times a day (BID) | ORAL | 11 refills | Status: DC

## 2022-09-11 MED ORDER — LIDOCAINE HCL 2 % IJ SOLN
1.5000 mL | Freq: Once | INTRAMUSCULAR | Status: AC
  Administered 2022-09-11: 30 mg via INTRADERMAL

## 2022-09-11 MED ORDER — LIDOCAINE HCL 2 % IJ SOLN: 1.5000 mL | Freq: Once | INTRAMUSCULAR | Status: DC

## 2022-09-11 MED ORDER — BUPIVACAINE HCL 0.5 % IJ SOLN
1.5000 mL | Freq: Once | INTRAMUSCULAR | Status: AC
  Administered 2022-09-11: 1.5 mL

## 2022-09-11 NOTE — Progress Notes (Signed)
Nerve block (w or w/o) steroid:without Pt signed consent YES   0.5% Bupivocaine 1.46mL LOT: [id number] EXP: 01/26 NDC: 75102-585-27  2% Lidocaine 1.5 ml  LOT: [id number] EXP: 09/2022 NDC: 36144-315-40

## 2022-09-11 NOTE — Progress Notes (Signed)
PATIENT: Debbie Stevens DOB: 03/13/1951  REASON FOR VISIT: follow up HISTORY FROM: patient, phone interpretor  PRIMARY NEUROLOGIST: Dr. Brett Fairy  HISTORY OF PRESENT ILLNESS: Today 09/11/22:  Debbie Stevens is a 72 y.o. female with a history of cervicalgia and occipital neuralgia.. Returns today for follow-up.  She reports that she has complete relief approximately 4 months after her last nerve blocks.  She states the last 2 months the pain returns.  She has about 3 to 4 days a week that she has discomfort.  Remains on amitriptyline and gabapentin.  She would like another nerve block today     08/11/2021: Debbie Stevens is a 72 year old female with a history of cervicalgia and occipital neuralgia.  She returns today for follow-up.  She reports that after her nerve blocks in July she had 5 months of relief.  She states in the last month her symptoms have returned.  She states it normally starts in the neck and radiates up the back of the head bilaterally.  She remains on amitriptyline, gabapentin and Relafen.  Patient states that she would prefer to have a nerve block versus increasing medication    REVIEW OF SYSTEMS: Out of a complete 14 system review of symptoms, the patient complains only of the following symptoms, and all other reviewed systems are negative.  ALLERGIES: Allergies  Allergen Reactions   Amlodipine Other (See Comments)    Flushing   Metoprolol Palpitations    HOME MEDICATIONS: Outpatient Medications Prior to Visit  Medication Sig Dispense Refill   acetaminophen (TYLENOL 8 HOUR ARTHRITIS PAIN) 650 MG CR tablet Take 1 tablet (650 mg total) by mouth every 8 (eight) hours as needed for pain. 90 tablet 1   amitriptyline (ELAVIL) 50 MG tablet TAKE 1 TABLET BY MOUTH AT BEDTIME 90 tablet 3   Boswellia-Glucosamine-Vit D (GLUCOSAMINE COMPLEX PO) Take 1 tablet by mouth daily. Muscle/joint pain     Cholecalciferol 25 MCG (1000 UT) capsule Take by mouth.     citalopram  (CELEXA) 40 MG tablet Take 40 mg by mouth daily.     doxazosin (CARDURA) 2 MG tablet Take 2 mg by mouth 2 (two) times daily.     esomeprazole (NEXIUM) 40 MG capsule      gabapentin (NEURONTIN) 100 MG capsule TAKE 1 CAPSULE BY MOUTH ONCE DAILY AFTER  LUNCH 30 capsule 11   metoprolol tartrate (LOPRESSOR) 25 MG tablet Take 75 mg by mouth 2 (two) times daily.     Omega-3 Fatty Acids (FISH OIL) 1000 MG CAPS Take 1,000 mg by mouth daily.     pravastatin (PRAVACHOL) 40 MG tablet Take 40 mg by mouth daily.     sucralfate (CARAFATE) 1 g tablet Take 1 tablet (1 g total) by mouth 4 (four) times daily -  with meals and at bedtime. 60 tablet 0   No facility-administered medications prior to visit.    PAST MEDICAL HISTORY: Past Medical History:  Diagnosis Date   GERD (gastroesophageal reflux disease)    Headache    Hypertension     PAST SURGICAL HISTORY: Past Surgical History:  Procedure Laterality Date   BILATERAL OOPHORECTOMY     KIDNEY SURGERY     right kidney removal.     FAMILY HISTORY: No family history on file.  SOCIAL HISTORY: Social History   Socioeconomic History   Marital status: Divorced    Spouse name: Not on file   Number of children: Not on file   Years of education:  Not on file   Highest education level: Not on file  Occupational History   Not on file  Tobacco Use   Smoking status: Never   Smokeless tobacco: Never  Vaping Use   Vaping Use: Never used  Substance and Sexual Activity   Alcohol use: No   Drug use: No   Sexual activity: Not on file  Other Topics Concern   Not on file  Social History Narrative   Not on file   Social Determinants of Health   Financial Resource Strain: Not on file  Food Insecurity: Not on file  Transportation Needs: Not on file  Physical Activity: Not on file  Stress: Not on file  Social Connections: Not on file  Intimate Partner Violence: Not on file      PHYSICAL EXAM  Vitals:   09/11/22 0947  BP: 138/81  Pulse:  69  Weight: 128 lb (58.1 kg)  Height: 5\' 2"  (1.575 m)    Body mass index is 23.41 kg/m.  Generalized: Well developed, in no acute distress   Neurological examination  Mentation: Alert oriented to time, place, history taking. Follows all commands speech and language fluent Cranial nerve II-XII: Pupils were equal round reactive to light. Extraocular movements were full, visual field were full on confrontational test. Facial sensation and strength were normal.  Head turning and shoulder shrug  were normal and symmetric. Motor: The motor testing reveals 5 over 5 strength of all 4 extremities. Good symmetric motor tone is noted throughout.  Sensory: Sensory testing is intact to soft touch on all 4 extremities. No evidence of extinction is noted.  Coordination: Cerebellar testing reveals good finger-nose-finger and heel-to-shin bilaterally.  Gait and station: Gait is normal. Reflexes: Deep tendon reflexes are symmetric and normal bilaterally.   DIAGNOSTIC DATA (LABS, IMAGING, TESTING) - I reviewed patient records, labs, notes, testing and imaging myself where available.  Lab Results  Component Value Date   WBC 6.8 07/27/2022   HGB 13.5 07/27/2022   HCT 40.2 07/27/2022   MCV 85.4 07/27/2022   PLT 206 07/27/2022      Component Value Date/Time   NA 133 (L) 07/27/2022 1837   K 3.4 (L) 07/27/2022 1837   CL 98 07/27/2022 1837   CO2 26 07/27/2022 1837   GLUCOSE 117 (H) 07/27/2022 1837   BUN 25 (H) 07/27/2022 1837   CREATININE 1.00 07/27/2022 1837   CALCIUM 9.4 07/27/2022 1837   PROT 7.7 12/13/2019 1602   ALBUMIN 4.4 12/13/2019 1602   AST 24 12/13/2019 1602   ALT 22 12/13/2019 1602   ALKPHOS 58 12/13/2019 1602   BILITOT 1.0 12/13/2019 1602   GFRNONAA 59 (L) 07/27/2022 1837   GFRAA 50 (L) 12/13/2019 1602       ASSESSMENT AND PLAN 72 y.o. year old female  has a past medical history of GERD (gastroesophageal reflux disease), Headache, and Hypertension. here with:  Occipital  Neuralgia/ Cervicalgia  -Occipital nerve block today -Continue amitriptyline 50 mg at bedtime -Increase gabapentin to 100 mg twice a day   Nerve Block for occiptal neuralgia   Bupivacaine 0.5% and Lidocaine 1:1 mixture was injected on the scalp bilaterally at several locations:   -On the occipital area of the head, 3 injections to bilateral occipital region of scalp, 0.5 cc per injection at the midpoint between the mastoid process and the occipital protuberance. 2 other injections were done one finger breadth from the initial injection, one at a 10 o'clock position and the other at a 2 o'clock  position. Bilaterally   The patient tolerated the injections well, no complications of the procedure were noted. Injections were made with a 27-gauge needle.    Ward Givens, MSN, NP-C 09/11/2022, 9:26 AM [facility] [address] [place], [place] [address] [phone]

## 2022-09-22 DIAGNOSIS — I10 Essential (primary) hypertension: Secondary | ICD-10-CM | POA: Diagnosis not present

## 2022-09-22 DIAGNOSIS — Z6825 Body mass index (BMI) 25.0-25.9, adult: Secondary | ICD-10-CM | POA: Diagnosis not present

## 2022-09-22 DIAGNOSIS — G44229 Chronic tension-type headache, not intractable: Secondary | ICD-10-CM | POA: Diagnosis not present

## 2022-09-22 DIAGNOSIS — F411 Generalized anxiety disorder: Secondary | ICD-10-CM | POA: Diagnosis not present

## 2022-10-06 DIAGNOSIS — I10 Essential (primary) hypertension: Secondary | ICD-10-CM | POA: Diagnosis not present

## 2022-10-11 DIAGNOSIS — I10 Essential (primary) hypertension: Secondary | ICD-10-CM | POA: Diagnosis not present

## 2022-10-11 DIAGNOSIS — Z6825 Body mass index (BMI) 25.0-25.9, adult: Secondary | ICD-10-CM | POA: Diagnosis not present

## 2022-10-12 ENCOUNTER — Other Ambulatory Visit: Payer: Self-pay | Admitting: Adult Health

## 2022-12-27 DIAGNOSIS — I700 Atherosclerosis of aorta: Secondary | ICD-10-CM | POA: Diagnosis not present

## 2022-12-27 DIAGNOSIS — R7303 Prediabetes: Secondary | ICD-10-CM | POA: Diagnosis not present

## 2022-12-27 DIAGNOSIS — K2970 Gastritis, unspecified, without bleeding: Secondary | ICD-10-CM | POA: Diagnosis not present

## 2022-12-27 DIAGNOSIS — Z6825 Body mass index (BMI) 25.0-25.9, adult: Secondary | ICD-10-CM | POA: Diagnosis not present

## 2022-12-27 DIAGNOSIS — K5900 Constipation, unspecified: Secondary | ICD-10-CM | POA: Diagnosis not present

## 2022-12-27 DIAGNOSIS — K219 Gastro-esophageal reflux disease without esophagitis: Secondary | ICD-10-CM | POA: Diagnosis not present

## 2022-12-27 DIAGNOSIS — J302 Other seasonal allergic rhinitis: Secondary | ICD-10-CM | POA: Diagnosis not present

## 2022-12-27 DIAGNOSIS — I10 Essential (primary) hypertension: Secondary | ICD-10-CM | POA: Diagnosis not present

## 2022-12-27 DIAGNOSIS — E7800 Pure hypercholesterolemia, unspecified: Secondary | ICD-10-CM | POA: Diagnosis not present

## 2023-03-21 ENCOUNTER — Encounter: Payer: Self-pay | Admitting: Adult Health

## 2023-03-21 ENCOUNTER — Ambulatory Visit (INDEPENDENT_AMBULATORY_CARE_PROVIDER_SITE_OTHER): Payer: Medicare Other | Admitting: Adult Health

## 2023-03-21 VITALS — BP 141/90 | HR 71 | Ht 60.0 in | Wt 130.0 lb

## 2023-03-21 DIAGNOSIS — M5481 Occipital neuralgia: Secondary | ICD-10-CM | POA: Diagnosis not present

## 2023-03-21 DIAGNOSIS — M542 Cervicalgia: Secondary | ICD-10-CM | POA: Diagnosis not present

## 2023-03-21 NOTE — Progress Notes (Signed)
PATIENT: Debbie Stevens DOB: Oct 14, 1950  REASON FOR VISIT: follow up HISTORY FROM: patient, phone interpretor  PRIMARY NEUROLOGIST: Dr. Vickey Huger  HISTORY OF PRESENT ILLNESS: Today 03/21/23:  Debbie Stevens is a 72 y.o. female with a history of cervicalgia and occipital neuralgia. Returns today for follow-up.  She reports that she has not had any significant pain in the neck or head since her last appointment.  She states that last week she had mild symptoms in the back of the neck but nothing significant.  She currently takes amitriptyline 50 mg at bedtime.  Reports that she is only doing gabapentin 100 mg daily.  We tried increasing to twice a day last visit.  She returns today for an evaluation.     09/11/22: Debbie Stevens is a 72 y.o. female with a history of cervicalgia and occipital neuralgia.. Returns today for follow-up.  She reports that she has complete relief approximately 4 months after her last nerve blocks.  She states the last 2 months the pain returns.  She has about 3 to 4 days a week that she has discomfort.  Remains on amitriptyline and gabapentin.  She would like another nerve block today  08/11/2021: Debbie Stevens is a 72 year old female with a history of cervicalgia and occipital neuralgia.  She returns today for follow-up.  She reports that after her nerve blocks in July she had 5 months of relief.  She states in the last month her symptoms have returned.  She states it normally starts in the neck and radiates up the back of the head bilaterally.  She remains on amitriptyline, gabapentin and Relafen.  Patient states that she would prefer to have a nerve block versus increasing medication    REVIEW OF SYSTEMS: Out of a complete 14 system review of symptoms, the patient complains only of the following symptoms, and all other reviewed systems are negative.  ALLERGIES: Allergies  Allergen Reactions   Amlodipine Other (See Comments)    Flushing   Metoprolol  Palpitations    HOME MEDICATIONS: Outpatient Medications Prior to Visit  Medication Sig Dispense Refill   acetaminophen (TYLENOL 8 HOUR ARTHRITIS PAIN) 650 MG CR tablet Take 1 tablet (650 mg total) by mouth every 8 (eight) hours as needed for pain. 90 tablet 1   amitriptyline (ELAVIL) 50 MG tablet TAKE 1 TABLET BY MOUTH AT BEDTIME 90 tablet 1   Boswellia-Glucosamine-Vit D (GLUCOSAMINE COMPLEX PO) Take 1 tablet by mouth daily. Muscle/joint pain     Cholecalciferol 25 MCG (1000 UT) capsule Take by mouth.     citalopram (CELEXA) 40 MG tablet Take 40 mg by mouth daily.     doxazosin (CARDURA) 2 MG tablet Take 2 mg by mouth 2 (two) times daily.     esomeprazole (NEXIUM) 40 MG capsule      gabapentin (NEURONTIN) 100 MG capsule Take 1 capsule (100 mg total) by mouth 2 (two) times daily. Morning and at noon (Patient taking differently: Take 100 mg by mouth daily at 2 PM. Morning and at noon) 60 capsule 11   metoprolol tartrate (LOPRESSOR) 25 MG tablet Take 75 mg by mouth 2 (two) times daily.     Omega-3 Fatty Acids (FISH OIL) 1000 MG CAPS Take 1,000 mg by mouth daily.     pravastatin (PRAVACHOL) 40 MG tablet Take 40 mg by mouth daily.     sucralfate (CARAFATE) 1 g tablet Take 1 tablet (1 g total) by mouth 4 (four) times daily -  with meals and at bedtime. 60 tablet 0   No facility-administered medications prior to visit.    PAST MEDICAL HISTORY: Past Medical History:  Diagnosis Date   GERD (gastroesophageal reflux disease)    Headache    Hypertension     PAST SURGICAL HISTORY: Past Surgical History:  Procedure Laterality Date   BILATERAL OOPHORECTOMY     KIDNEY SURGERY     right kidney removal.     FAMILY HISTORY: Family History  Problem Relation Age of Onset   Headache Daughter     SOCIAL HISTORY: Social History   Socioeconomic History   Marital status: Divorced    Spouse name: Not on file   Number of children: Not on file   Years of education: Not on file   Highest  education level: Not on file  Occupational History   Not on file  Tobacco Use   Smoking status: Never   Smokeless tobacco: Never  Vaping Use   Vaping status: Never Used  Substance and Sexual Activity   Alcohol use: No   Drug use: No   Sexual activity: Not on file  Other Topics Concern   Not on file  Social History Narrative   Not on file   Social Determinants of Health   Financial Resource Strain: Medium Risk (09/19/2018)   Received from [facility], [facility]   Overall Financial Resource Strain (CARDIA)    Difficulty of Paying Living Expenses: Somewhat hard  Food Insecurity: No Food Insecurity (12/23/2021)   Received from [facility], [facility]   Hunger Vital Sign    Worried About Running Out of Food in the Last Year: Never true    Ran Out of Food in the Last Year: Never true  Transportation Needs: No Transportation Needs (09/19/2018)   Received from [facility], [facility]   [facility] - Transportation    Lack of Transportation (Medical): No    Lack of Transportation (Non-Medical): No  Physical Activity: Insufficiently Active (09/19/2018)   Received from [facility], [facility]   Exercise Vital Sign    Days of Exercise per Week: 3 days    Minutes of Exercise per Session: 40 min  Stress: Not on file  Social Connections: Unknown (12/23/2021)   Received from [facility], [facility]   Social Network    Social Network: Not on file  Intimate Partner Violence: Unknown (12/08/2021)   Received from [facility], [facility]   HITS    Physically Hurt: Not on file    Insult or Talk Down To: Not on file    Threaten Physical Harm: Not on file    Scream or Curse: Not on file      PHYSICAL EXAM  Vitals:   03/21/23 1050  BP: (!) 141/90  Pulse: 71  Weight: 130 lb (59 kg)  Height: 5' (1.524 m)    Body mass index is 25.39 kg/m.  Generalized: Well developed, in no acute distress   Neurological examination  Mentation: Alert oriented to  time, place, history taking. Follows all commands speech and language fluent Cranial nerve II-XII: Pupils were equal round reactive to light. Extraocular movements were full, visual field were full on confrontational test. Facial sensation and strength were normal.  Head turning and shoulder shrug  were normal and symmetric. Motor: The motor testing reveals 5 over 5 strength of all 4 extremities. Good symmetric motor tone is noted throughout.  Sensory: Sensory testing is intact to soft touch on  all 4 extremities. No evidence of extinction is noted.  Coordination: Cerebellar testing reveals good finger-nose-finger and heel-to-shin bilaterally.  Gait and station: Gait is normal. Reflexes: Deep tendon reflexes are symmetric and normal bilaterally.   DIAGNOSTIC DATA (LABS, IMAGING, TESTING) - I reviewed patient records, labs, notes, testing and imaging myself where available.  Lab Results  Component Value Date   WBC 6.8 07/27/2022   HGB 13.5 07/27/2022   HCT 40.2 07/27/2022   MCV 85.4 07/27/2022   PLT 206 07/27/2022      Component Value Date/Time   NA 133 (L) 07/27/2022 1837   K 3.4 (L) 07/27/2022 1837   CL 98 07/27/2022 1837   CO2 26 07/27/2022 1837   GLUCOSE 117 (H) 07/27/2022 1837   BUN 25 (H) 07/27/2022 1837   CREATININE 1.00 07/27/2022 1837   CALCIUM 9.4 07/27/2022 1837   PROT 7.7 12/13/2019 1602   ALBUMIN 4.4 12/13/2019 1602   AST 24 12/13/2019 1602   ALT 22 12/13/2019 1602   ALKPHOS 58 12/13/2019 1602   BILITOT 1.0 12/13/2019 1602   GFRNONAA 59 (L) 07/27/2022 1837   GFRAA 50 (L) 12/13/2019 1602       ASSESSMENT AND PLAN 72 y.o. year old female  has a past medical history of GERD (gastroesophageal reflux disease), Headache, and Hypertension. here with:  Occipital Neuralgia/ Cervicalgia  - Stable - Continue amitriptyline 50 mg at bedtime - Try taking gabapentin  100 mg twice a day -Patient inquired about another nerve block today however her symptoms are relatively  stable.  Certainly if her symptoms worsen we can consider repeating a nerve block.     Butch Penny, MSN, NP-C 03/21/2023, 11:02 AM [facility] [address] [place], [place] [address] [phone]

## 2023-03-21 NOTE — Patient Instructions (Signed)
Your Plan:  Continue gabapentin 100 mg twice a day  Continue Amitriptyline 50 mg at bedtime  If your symptoms worsen or you develop new symptoms please let us know.   Thank you for coming to see [place] at [facility]. I hope we have been able to provide you high quality care today.  You may receive a patient satisfaction survey over the next few weeks. We would appreciate your feedback and comments so that we may continue to improve ourselves and the health of our patients.

## 2023-04-13 ENCOUNTER — Other Ambulatory Visit: Payer: Self-pay | Admitting: Adult Health

## 2023-04-17 NOTE — Telephone Encounter (Signed)
Pt's daughter called wanting to know when this will be filled for the pt. Please advise.

## 2023-04-18 ENCOUNTER — Other Ambulatory Visit: Payer: Self-pay

## 2023-08-01 DIAGNOSIS — E559 Vitamin D deficiency, unspecified: Secondary | ICD-10-CM | POA: Diagnosis not present

## 2023-08-01 DIAGNOSIS — N1831 Chronic kidney disease, stage 3a: Secondary | ICD-10-CM | POA: Diagnosis not present

## 2023-08-01 DIAGNOSIS — E7800 Pure hypercholesterolemia, unspecified: Secondary | ICD-10-CM | POA: Diagnosis not present

## 2023-08-01 DIAGNOSIS — K219 Gastro-esophageal reflux disease without esophagitis: Secondary | ICD-10-CM | POA: Diagnosis not present

## 2023-08-01 DIAGNOSIS — F411 Generalized anxiety disorder: Secondary | ICD-10-CM | POA: Diagnosis not present

## 2023-08-01 DIAGNOSIS — R7303 Prediabetes: Secondary | ICD-10-CM | POA: Diagnosis not present

## 2023-08-01 DIAGNOSIS — Z6825 Body mass index (BMI) 25.0-25.9, adult: Secondary | ICD-10-CM | POA: Diagnosis not present

## 2023-08-01 DIAGNOSIS — I10 Essential (primary) hypertension: Secondary | ICD-10-CM | POA: Diagnosis not present

## 2023-09-10 DIAGNOSIS — Z1231 Encounter for screening mammogram for malignant neoplasm of breast: Secondary | ICD-10-CM | POA: Diagnosis not present

## 2023-09-19 ENCOUNTER — Other Ambulatory Visit: Payer: Self-pay | Admitting: Adult Health

## 2023-09-19 NOTE — Telephone Encounter (Signed)
Rx refilled per last office visit note.

## 2023-10-09 ENCOUNTER — Telehealth: Payer: Self-pay | Admitting: Adult Health

## 2023-10-09 ENCOUNTER — Other Ambulatory Visit: Payer: Self-pay | Admitting: Adult Health

## 2023-10-09 NOTE — Telephone Encounter (Signed)
 LVM using interpreter services to inform pt of need to reschedule 10/10/23 appt - office closed due to weather

## 2023-10-10 ENCOUNTER — Ambulatory Visit: Payer: Medicare Other | Admitting: Adult Health

## 2023-10-12 DIAGNOSIS — Z03818 Encounter for observation for suspected exposure to other biological agents ruled out: Secondary | ICD-10-CM | POA: Diagnosis not present

## 2023-10-12 DIAGNOSIS — J069 Acute upper respiratory infection, unspecified: Secondary | ICD-10-CM | POA: Diagnosis not present

## 2023-10-12 DIAGNOSIS — Z6825 Body mass index (BMI) 25.0-25.9, adult: Secondary | ICD-10-CM | POA: Diagnosis not present

## 2023-10-12 DIAGNOSIS — R509 Fever, unspecified: Secondary | ICD-10-CM | POA: Diagnosis not present

## 2023-10-12 DIAGNOSIS — R051 Acute cough: Secondary | ICD-10-CM | POA: Diagnosis not present

## 2023-10-17 ENCOUNTER — Ambulatory Visit (INDEPENDENT_AMBULATORY_CARE_PROVIDER_SITE_OTHER): Payer: Medicare Other | Admitting: Adult Health

## 2023-10-17 ENCOUNTER — Telehealth: Payer: Self-pay | Admitting: Adult Health

## 2023-10-17 ENCOUNTER — Encounter: Payer: Self-pay | Admitting: Adult Health

## 2023-10-17 VITALS — BP 110/75 | HR 82 | Ht 60.0 in | Wt 128.6 lb

## 2023-10-17 DIAGNOSIS — M542 Cervicalgia: Secondary | ICD-10-CM

## 2023-10-17 DIAGNOSIS — M5481 Occipital neuralgia: Secondary | ICD-10-CM

## 2023-10-17 NOTE — Progress Notes (Signed)
 PATIENT: Debbie Stevens DOB: 1951/03/18  REASON FOR VISIT: follow up HISTORY FROM: patient, nterpretor onsite PRIMARY NEUROLOGIST: Dr. Vickey Huger  Chief Complaint  Patient presents with   Follow-up    Rm 19, interpreter, [place].  Last 3 months gabapentin and amitriptyline not helping. Requesting nerve block.      HISTORY OF PRESENT ILLNESS: Today 10/17/23:  Debbie Stevens is a 73 y.o. female with a history of cervicalgia and occipital neuralgia.. Returns today for follow-up. Reports that she is not currently in any pain but reports she typically gets discomfort around 3pm everyday. Reports that it starts in the neck and radiates up the center of the head to the parietal region. Reports a burning sensation. Gabapentin was written for her to take AM and at noon however she reports that she has been taking in the AM and at bedtime. She remains on amitriptyline 50 mg at bedtime. She states that nerve blocks have helped in the past- although she was slightly confused on the mechanism of the nerve blocks- she thought it blocked blood flow. We discussed increasing gabapentin dosage but patient seemed resistant. Reports tolerating gabapentin well.   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 female with a history of cervicalgia and occipital neuralgia. Returns today for follow-up.  She reports that she has not had any significant pain in the neck or head since her last appointment.  She states that last week she had mild symptoms in the back of the neck but nothing  significant.  She currently takes amitriptyline 50 mg at bedtime.  Reports that she is only doing gabapentin 100 mg daily.  We tried increasing to twice a day last visit.  She returns today for an evaluation.  09/11/22: Debbie Albino Debbie Stevens is a 73 y.o. female with a history of cervicalgia and occipital neuralgia.. Returns today for follow-up.  She reports that she has complete relief approximately 4 months after her last nerve blocks.  She states the last 2 months the pain returns.  She has about 3 to 4 days a week that she has discomfort.  Remains on amitriptyline and gabapentin.  She would like another nerve block today  08/11/2021: Debbie Stevens is a 73 year old female with a history of cervicalgia and occipital neuralgia.  She returns today for follow-up.  She reports that after her nerve blocks in July she had 5 months of relief.  She states in the last month her symptoms have returned.  She states it normally starts in the neck and radiates up the back of the head bilaterally.  She remains on amitriptyline, gabapentin and Relafen.  Patient states that she would prefer to have a nerve block versus increasing medication    REVIEW OF SYSTEMS: Out of a complete 14 system review of symptoms, the patient complains only of the following symptoms, and all other reviewed systems are negative.  ALLERGIES: Allergies  Allergen Reactions   Amlodipine Other (See Comments)    Flushing   Metoprolol Palpitations    HOME MEDICATIONS: Outpatient Medications Prior to Visit  Medication Sig Dispense Refill  acetaminophen (TYLENOL 8 HOUR ARTHRITIS PAIN) 650 MG CR tablet Take 1 tablet (650 mg total) by mouth every 8 (eight) hours as needed for pain. 90 tablet 1   amitriptyline (ELAVIL) 50 MG tablet TAKE 1 TABLET BY MOUTH AT BEDTIME 90 tablet 0   Boswellia-Glucosamine-Vit D (GLUCOSAMINE COMPLEX PO) Take 1 tablet by mouth daily. Muscle/joint pain     Cholecalciferol 25 MCG (1000 UT) capsule Take 1,000 Units by mouth  daily.     citalopram (CELEXA) 40 MG tablet Take 40 mg by mouth daily.     doxazosin (CARDURA) 2 MG tablet Take 2 mg by mouth 2 (two) times daily.     esomeprazole (NEXIUM) 40 MG capsule 40 mg daily at 12 noon.     gabapentin (NEURONTIN) 100 MG capsule TAKE ONE CAPSULE BY MOUTH IN THE MORNING AND ONE CAPSULE AT NOON 60 capsule 0   hydrochlorothiazide (HYDRODIURIL) 25 MG tablet Take 25 mg by mouth daily.     losartan (COZAAR) 25 MG tablet Take 25 mg by mouth daily.     metoprolol tartrate (LOPRESSOR) 25 MG tablet Take 75 mg by mouth 2 (two) times daily.     Omega-3 Fatty Acids (FISH OIL) 1000 MG CAPS Take 1,000 mg by mouth daily.     pravastatin (PRAVACHOL) 40 MG tablet Take 40 mg by mouth daily.     sucralfate (CARAFATE) 1 g tablet Take 1 tablet (1 g total) by mouth 4 (four) times daily -  with meals and at bedtime. 60 tablet 0   No facility-administered medications prior to visit.    PAST MEDICAL HISTORY: Past Medical History:  Diagnosis Date   GERD (gastroesophageal reflux disease)    Headache    Hypertension     PAST SURGICAL HISTORY: Past Surgical History:  Procedure Laterality Date   BILATERAL OOPHORECTOMY     KIDNEY SURGERY     right kidney removal.     FAMILY HISTORY: Family History  Problem Relation Age of Onset   Headache Daughter     SOCIAL HISTORY: Social History   Socioeconomic History   Marital status: Divorced    Spouse name: Not on file   Number of children: Not on file   Years of education: Not on file   Highest education level: Not on file  Occupational History   Not on file  Tobacco Use   Smoking status: Never   Smokeless tobacco: Never  Vaping Use   Vaping status: Never Used  Substance and Sexual Activity   Alcohol use: No   Drug use: No   Sexual activity: Not on file  Other Topics Concern   Not on file  Social History Narrative   Not on file   Social Drivers of Health   Financial Resource Strain: Medium Risk (09/19/2018)   Received  from [facility], [facility]   Overall Financial Resource Strain (CARDIA)    Difficulty of Paying Living Expenses: Somewhat hard  Food Insecurity: No Food Insecurity (12/23/2021)   Received from [facility], [facility]   Hunger Vital Sign    Worried About Running Out of Food in the Last Year: Never true    Ran Out of Food in the Last Year: Never true  Transportation Needs: No Transportation Needs (09/19/2018)   Received from [facility], [facility]   [facility] - Transportation    Lack of Transportation (Medical): No    Lack of Transportation (Non-Medical): No  Physical Activity: Insufficiently Active (09/19/2018)  Received from [facility], [facility]   Exercise Vital Sign    Days of Exercise per Week: 3 days    Minutes of Exercise per Session: 40 min  Stress: Not on file  Social Connections: Unknown (12/23/2021)   Received from [facility], [facility]   Social Network    Social Network: Not on file  Intimate Partner Violence: Unknown (12/08/2021)   Received from [facility], [facility]   HITS    Physically Hurt: Not on file    Insult or Talk Down To: Not on file    Threaten Physical Harm: Not on file    Scream or Curse: Not on file      PHYSICAL EXAM  Vitals:   10/17/23 1109  BP: 110/75  Pulse: 82  Weight: 128 lb 9.6 oz (58.3 kg)  Height: 5' (1.524 m)    Body mass index is 25.12 kg/m.  Generalized: Well developed, in no acute distress   Neurological examination  Mentation: Alert oriented to time, place, history taking. Follows all commands speech and language fluent Cranial nerve II-XII: Pupils were equal round reactive to light. Extraocular movements were full, visual field were full on confrontational test. Facial sensation and strength were normal.  Head turning and shoulder shrug  were normal and symmetric. Motor: The motor testing reveals 5 over 5 strength of all 4 extremities. Good symmetric motor tone is noted throughout.   Sensory: Sensory testing is intact to soft touch on all 4 extremities. No evidence of extinction is noted.  Coordination: Cerebellar testing reveals good finger-nose-finger and heel-to-shin bilaterally.  Gait and station: Gait is normal. Reflexes: Deep tendon reflexes are symmetric and normal bilaterally.   DIAGNOSTIC DATA (LABS, IMAGING, TESTING) - I reviewed patient records, labs, notes, testing and imaging myself where available.  Lab Results  Component Value Date   WBC 6.8 07/27/2022   HGB 13.5 07/27/2022   HCT 40.2 07/27/2022   MCV 85.4 07/27/2022   PLT 206 07/27/2022      Component Value Date/Time   NA 133 (L) 07/27/2022 1837   K 3.4 (L) 07/27/2022 1837   CL 98 07/27/2022 1837   CO2 26 07/27/2022 1837   GLUCOSE 117 (H) 07/27/2022 1837   BUN 25 (H) 07/27/2022 1837   CREATININE 1.00 07/27/2022 1837   CALCIUM 9.4 07/27/2022 1837   PROT 7.7 12/13/2019 1602   ALBUMIN 4.4 12/13/2019 1602   AST 24 12/13/2019 1602   ALT 22 12/13/2019 1602   ALKPHOS 58 12/13/2019 1602   BILITOT 1.0 12/13/2019 1602   GFRNONAA 59 (L) 07/27/2022 1837   GFRAA 50 (L) 12/13/2019 1602       ASSESSMENT AND PLAN 73 y.o. year old female  has a past medical history of GERD (gastroesophageal reflux disease), Headache, and Hypertension. here with:  Occipital Neuralgia/ Cervicalgia  - Not currently in discomfort  - Continue amitriptyline 50 mg at bedtime - Encouraged patient to take gabapentin100 mg in the AM and again between 12-1 - Patient inquired about another nerve block today however I did advise that she is not currently in discomfort. I think taking gabapentin at noon vs at bedtime would give her better benefit.  - I will get her next appointment with Dr. Vickey Huger in the event that gabapentin doesn't help and if she she wants routine nerve blocks then we could refer to [facility] pending Dr. Oliva Bustard discretion.  - FU in 3-4 months with Dr. Vickey Huger    To note: I discussed  plan of care with supervising MD Dr. Lucia Gaskins. She advised that we do not do routine nerve blocks but we could refer to [facility] for possible medial nerve branch block. She reviewed cervical MRI and felt this could be helpful in the future.   Butch Penny, MSN, NP-C 10/17/2023, 11:17 AM [facility] [address], [address] [place], [place] [address] [phone]

## 2023-10-17 NOTE — Telephone Encounter (Signed)
 Patient reported that she was taking it in the morning and at bedtime. I advised her to try taking it in the AM and again  between 12-1 since she has pain that comes everyday at [facility].

## 2023-10-17 NOTE — Telephone Encounter (Signed)
 I called daughter.  I relayed that per appt interpreter relayed that mother taking gabapetin 100mg  bid am and bedtime.  Mother said was told wrong,  she is taking 0700 and 1300.  Is there anyway to increase dose for her.  She wears a beanie type ice pack for her head.  I relayed appt in 02/2024 with primary MD, Dr. Vickey Huger.  We donot routinely do occipital nerve blocks, trying medications.  She appreciated call back and would see if increase a consideration.

## 2023-10-17 NOTE — Telephone Encounter (Signed)
 Pt';s daughter, Fernand Parkins my said Nurse Practitioner recommended a higher dosage at her visit today for gabapentin (NEURONTIN) 100 MG capsule. What is new recommended dosage? Would like a call from the nurse.

## 2023-10-17 NOTE — Patient Instructions (Addendum)
 Take Gabapentin 100 mg in the AM and again around 1-2PM.  Continue Amitriptyline 50 mg at bedtime.

## 2023-10-17 NOTE — Telephone Encounter (Signed)
 I called daughter,  mother is taking gabapentin 100mg  po bid (0700 and 1300) now.  ? About increasing dose. I will have to check and see what MM/NP wanted to do at this time and get back with her.

## 2023-10-18 MED ORDER — GABAPENTIN 100 MG PO CAPS: ORAL_CAPSULE | ORAL | 6 refills | Status: DC

## 2023-10-18 NOTE — Telephone Encounter (Signed)
 I called and spoke to the patient's daughter.  She feels that the translator may not have relayed the messages from her mom appropriately.  She states that her mom typically complains of neck pain in the afternoons.  She has been taking gabapentin 100 mg in the morning and at noon.  Daughter is wondering if we can increase the dose.  I will increase to 100 mg in the morning and 200 mg at noon.  Advised that this is not helpful she should call back and let us know and we can further adjust the dose.

## 2023-11-12 DIAGNOSIS — M25561 Pain in right knee: Secondary | ICD-10-CM | POA: Diagnosis not present

## 2023-11-22 DIAGNOSIS — M25551 Pain in right hip: Secondary | ICD-10-CM | POA: Diagnosis not present

## 2023-12-07 IMAGING — DX DG CHEST 2V
2 series · 2 of 2 positions shown · non-contrast
Comparison: Chest x-ray dated August 20, 2018

CLINICAL DATA: Chest pain

EXAM:
CHEST - 2 VIEW

[chest pa]
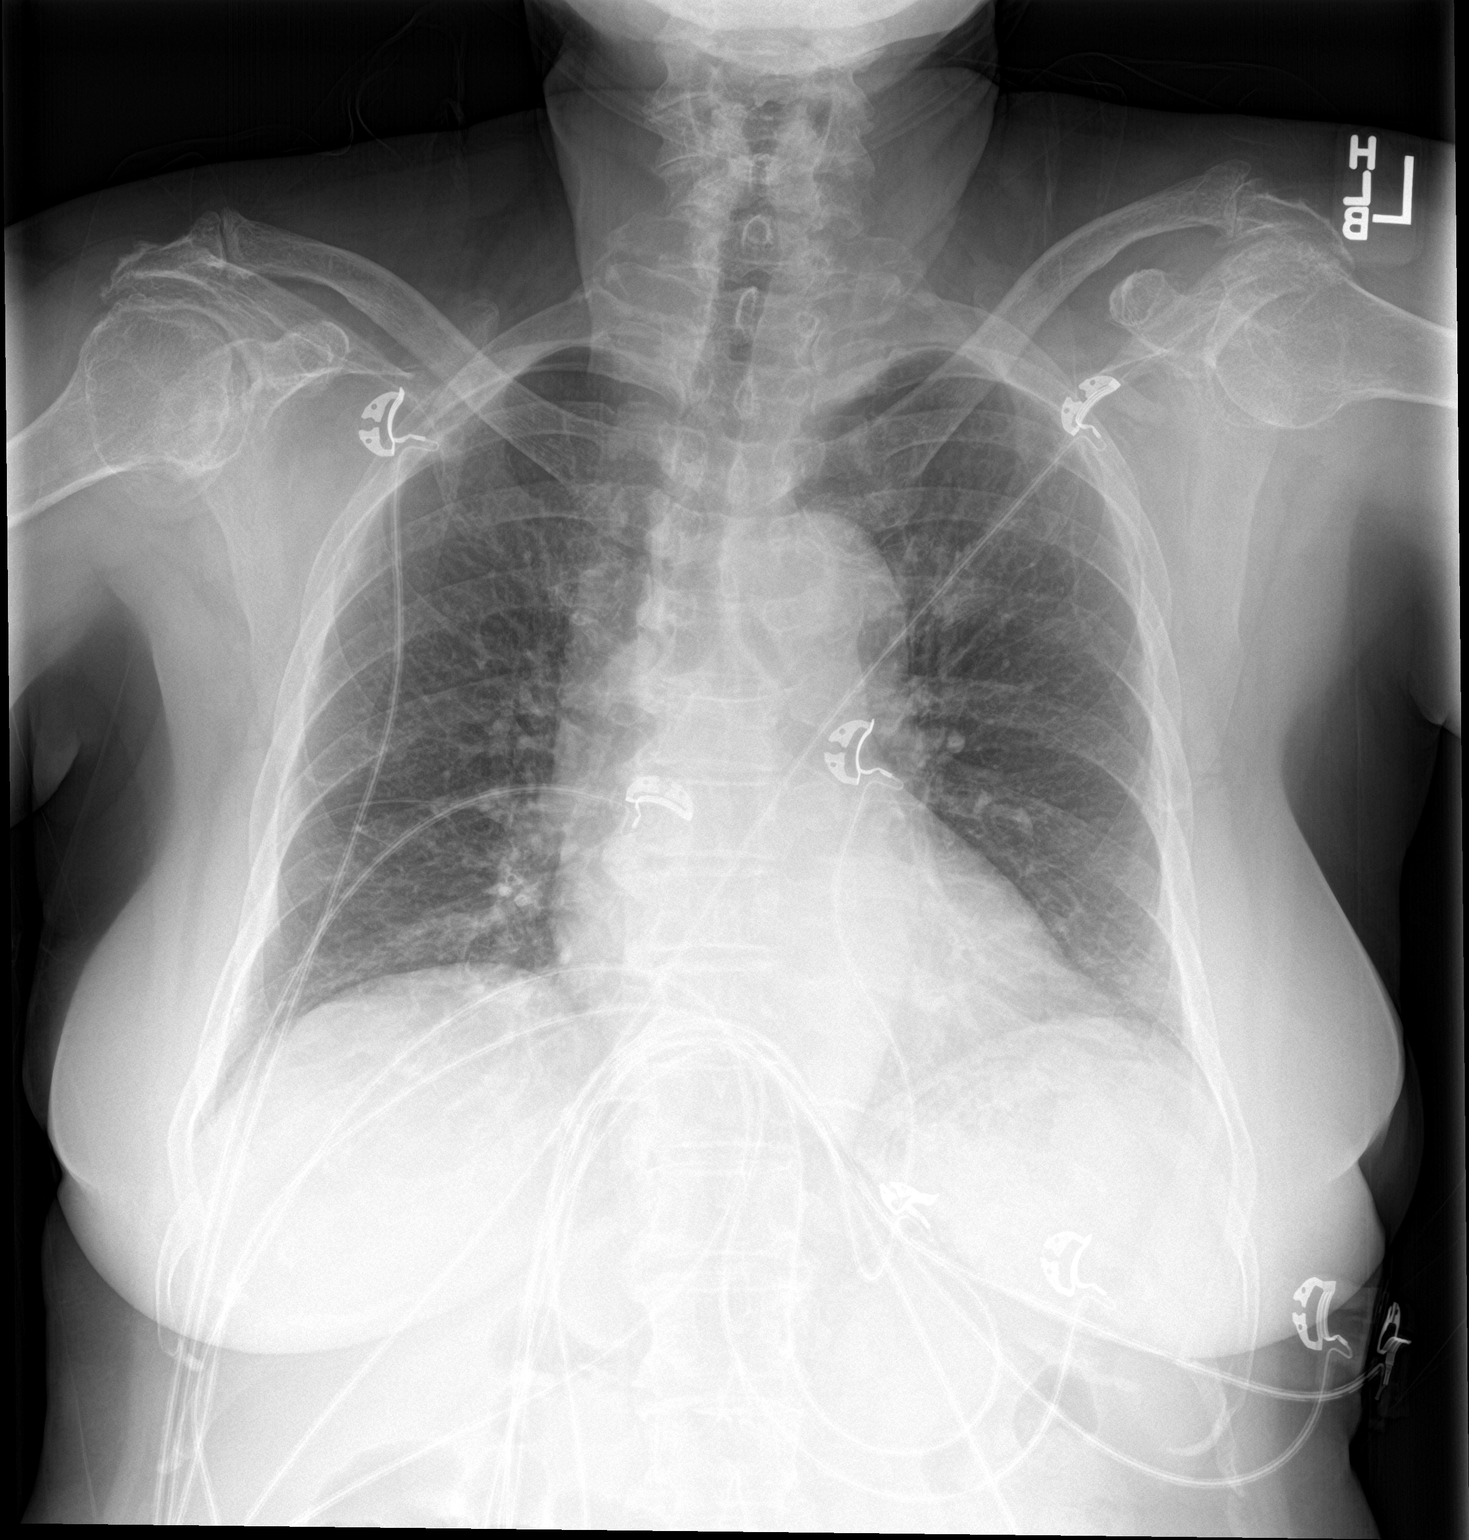

[chest lat]
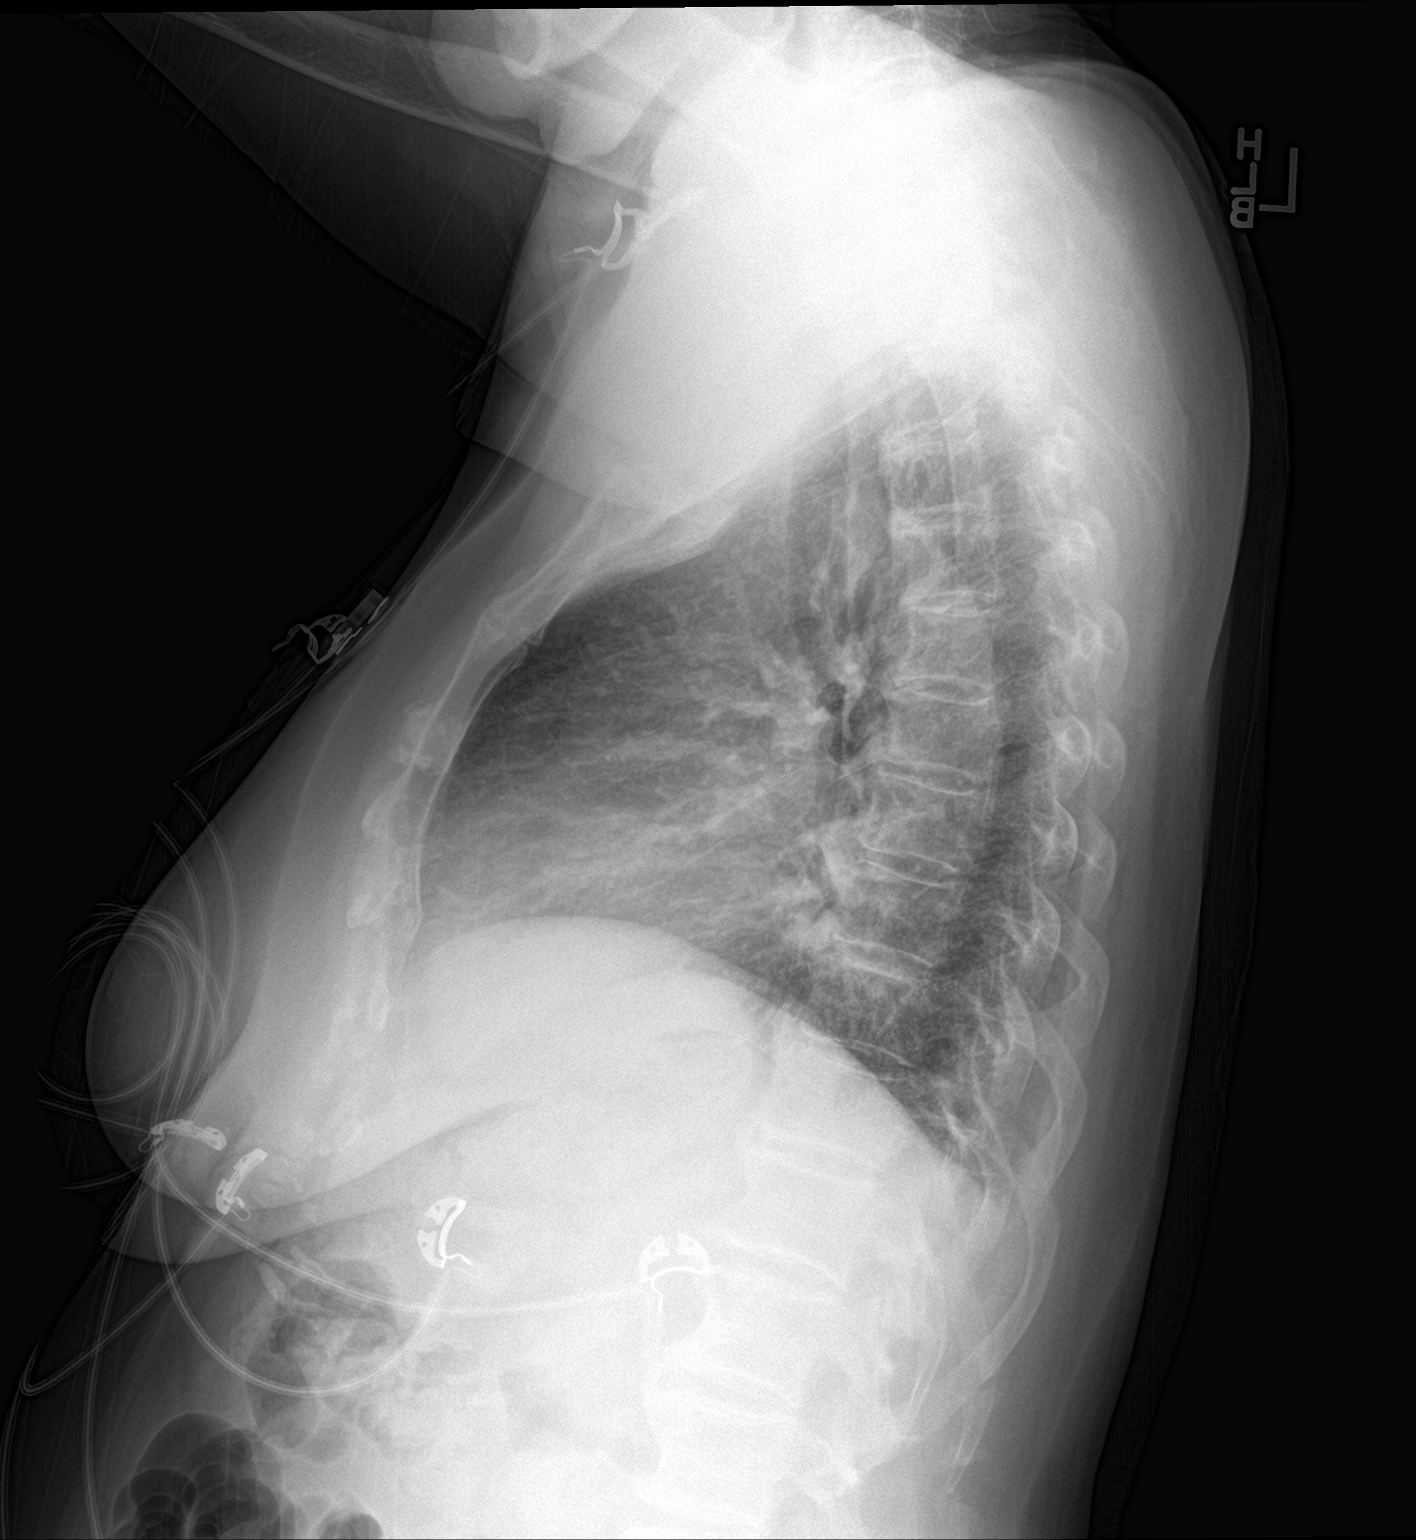

[2 of 2 positions shown; findings below may reference images not displayed]

FINDINGS: Cardiac and mediastinal contours are unchanged. Mild bibasilar
opacities, likely due to atelectasis. No large pleural effusion or
pneumothorax.
IMPRESSION: No active cardiopulmonary disease.

## 2023-12-10 DIAGNOSIS — M25561 Pain in right knee: Secondary | ICD-10-CM | POA: Diagnosis not present

## 2023-12-10 DIAGNOSIS — Z79899 Other long term (current) drug therapy: Secondary | ICD-10-CM | POA: Diagnosis not present

## 2023-12-26 ENCOUNTER — Ambulatory Visit: Attending: Sports Medicine | Admitting: Physical Therapy

## 2023-12-26 ENCOUNTER — Encounter: Payer: Self-pay | Admitting: Physical Therapy

## 2023-12-26 ENCOUNTER — Other Ambulatory Visit: Payer: Self-pay

## 2023-12-26 DIAGNOSIS — M6281 Muscle weakness (generalized): Secondary | ICD-10-CM | POA: Diagnosis not present

## 2023-12-26 DIAGNOSIS — M79604 Pain in right leg: Secondary | ICD-10-CM

## 2023-12-26 DIAGNOSIS — R29898 Other symptoms and signs involving the musculoskeletal system: Secondary | ICD-10-CM | POA: Diagnosis not present

## 2023-12-26 NOTE — Therapy (Signed)
 OUTPATIENT PHYSICAL THERAPY LOWER EXTREMITY EVALUATION   Patient Name: Debbie Stevens MRN: [id number] DOB:15-Aug-1951, 73 y.o., female Today's Date: 12/26/2023  END OF SESSION:  PT End of Session - 12/26/23 1258     Visit Number 1    Number of Visits 13    Date for PT Re-Evaluation 02/06/24    Authorization Type MCR    Authorization Time Period 12/26/23 to 02/06/24    Progress Note Due on Visit 10    PT Start Time 1147    PT Stop Time 1225    PT Time Calculation (min) 38 min    Activity Tolerance Patient tolerated treatment well    Behavior During Therapy WFL for tasks assessed/performed             Past Medical History:  Diagnosis Date   GERD (gastroesophageal reflux disease)    Headache    Hypertension    Past Surgical History:  Procedure Laterality Date   BILATERAL OOPHORECTOMY     KIDNEY SURGERY     right kidney removal.    Patient Active Problem List   Diagnosis Date Noted   Cervicalgia of occipito-atlanto-axial region 04/06/2021   Chronic tension-type headache, intractable 12/25/2019    PCP: Perley Bradley MD   REFERRING PROVIDER: Rance Burrows, MD  REFERRING DIAG: [phone] (ICD-10-CM) - Pain in right hip  THERAPY DIAG:  Pain in right leg  Muscle weakness (generalized)  Other symptoms and signs involving the musculoskeletal system  Rationale for Evaluation and Treatment: Rehabilitation  ONSET DATE: 2 months   SUBJECTIVE:   SUBJECTIVE STATEMENT:  Right leg is hurting from the hip all the way down to the ground. No clear MOI, has to sit down deep into the couch which might be aggravating the hip as it is hard to get out of. Did have a fall, landed on all fours but didn't hit hip. No hx of back issues. Pain was severe 2 months ago, MD gave her some extra strength medicine   PERTINENT HISTORY: See above  PAIN:  Are you having pain?  "Moderate 4-5/10  RLE, feels very stiff when getting up in the mornings/very difficult to describe. No  significant change in pain that she's noticed but TM did make pain worse    PRECAUTIONS: None  RED FLAGS: None   WEIGHT BEARING RESTRICTIONS: No  FALLS:  Has patient fallen in last 6 months? Yes. Number of falls 1, no FOF   LIVING ENVIRONMENT: Lives with: lives with their family Lives in: House/apartment Stairs:  flight  OCCUPATION: retired  PLOF: Independent, Independent with basic ADLs, Independent with gait, and Independent with transfers  PATIENT GOALS: get rid of pain, be able to use TM again   NEXT MD VISIT: mid-June 2025  OBJECTIVE:  Note: Objective measures were completed at Evaluation unless otherwise noted.    PATIENT SURVEYS:   Attempted PSFS but unable to get accurate scores even with assistance of formal interpreter, deferred    COGNITION: Overall cognitive status: Within functional limits for tasks assessed       MUSCLE LENGTH:  R HS and piriformis mild limitation    LUMBAR AROM  Flexion WNL, some discomfort R LE; RFIS some possible radicular sx  Extension WNL no discomfort; REIS made pain worse  Lateral flexion mod limitation B  Rotation severe limitation B     LOWER EXTREMITY MMT:  MMT Right eval Left eval  Hip flexion 4+ 4+  Hip extension    Hip abduction 4+ 4+  Hip adduction    Hip internal rotation    Hip external rotation    Knee flexion 4+ 4+  Knee extension 4+ 4+  Ankle dorsiflexion    Ankle plantarflexion    Ankle inversion    Ankle eversion     (Blank rows = not tested)  LOWER EXTREMITY SPECIAL TESTS:   SLR test (-) R LE, FADIR (+) R LE, FABER (-) R LE, noted leg length difference with R LE longer than L, tends to have pain in R hip when laying on L side   FUNCTIONAL TESTS:    SLS no more than 5 seconds B                                                                                                                                   TREATMENT DATE:   12/26/23  Eval, HEP, POC    PATIENT EDUCATION:   Education details: exam findings, HEP, POC  Person educated: Patient Education method: Explanation, Demonstration, and Handouts Education comprehension: verbalized understanding, returned demonstration, and needs further education  HOME EXERCISE PROGRAM:  Access Code: [email or web address] URL: [email or web address] Date: 12/26/2023 Prepared by: Terrel Ferries  Exercises - Hooklying Sacroiliac Joint Isometric  - 1-2 x daily - 7 x weekly - 1 sets - 6 reps - 5 seconds  hold - Supine Transversus Abdominis Bracing - Hands on Stomach  - 5 x daily - 7 x weekly - 1 sets - 10 reps - 5 seconds  hold - Seated Figure 4 Piriformis Stretch  - 1-2 x daily - 7 x weekly - 1 sets - 3 reps - 30 seconds  hold  ASSESSMENT:  CLINICAL IMPRESSION: Patient is a 73 y.o. F who was seen today for physical therapy evaluation and treatment for M25.551 (ICD-10-CM) - Pain in right hip. Objective findings as above I do think that she very likely has some SI involvement and this did respond moderately well to MET attempts this morning. Will make every effort to improve symptoms and assist in return to exercise.   OBJECTIVE IMPAIRMENTS: decreased activity tolerance, decreased balance, decreased mobility, difficulty walking, decreased ROM, decreased strength, hypomobility, increased fascial restrictions, increased muscle spasms, impaired flexibility, and pain.   ACTIVITY LIMITATIONS: sitting, standing, squatting, transfers, and locomotion level  PARTICIPATION LIMITATIONS: driving, shopping, community activity, and yard work  PERSONAL FACTORS: Education, Financial risk analyst, Past/current experiences, Social background, and Time since onset of injury/illness/exacerbation are also affecting patient's functional outcome.   REHAB POTENTIAL: Good  CLINICAL DECISION MAKING: Stable/uncomplicated  EVALUATION COMPLEXITY: Low   GOALS: Goals reviewed with patient? No  SHORT TERM GOALS: Target date: 01/16/2024   Will be compliant  with appropriate progressive HEP  Baseline: Goal status: INITIAL  2.  SI joint involvement to have been resolved  Baseline:  Goal status: INITIAL  3.  Will demonstrate improved awareness of functional posture with all tasks  Baseline:  Goal status: INITIAL   LONG TERM  GOALS: Target date: 02/06/2024    MMT to be 5/5 in all tested groups  Baseline:  Goal status: INITIAL  2.  Will be able to maintain SLS for 15 seconds B to show improved functional balance  Baseline:  Goal status: INITIAL  3.  Will have been able to return to exercise as desired with no pain above resting levels  Baseline:  Goal status: INITIAL  4.  Pain R LE to be no more than 2/10 at worst with all functional tasks and exercise  Baseline:  Goal status: INITIAL    PLAN:  PT FREQUENCY: 2x/week  PT DURATION: 6 weeks  PLANNED INTERVENTIONS: 97750- Physical Performance Testing, 97110-Therapeutic exercises, 97530- Therapeutic activity, [id number]- Neuromuscular re-education, 97535- Self Care, [address]- Manual therapy, 97033- Ionotophoresis 4mg /ml Dexamethasone, Taping, and Dry Needling  PLAN FOR NEXT SESSION: recheck SI, re-teach SI MET PRN. Core and proximal strength, lumbar and hip mobility/flexibilty   Terrel Ferries, PT, DPT 12/26/23 12:59 PM

## 2024-01-01 ENCOUNTER — Ambulatory Visit: Admitting: Physical Therapy

## 2024-01-01 DIAGNOSIS — M79604 Pain in right leg: Secondary | ICD-10-CM

## 2024-01-01 DIAGNOSIS — M6281 Muscle weakness (generalized): Secondary | ICD-10-CM | POA: Diagnosis not present

## 2024-01-01 DIAGNOSIS — R29898 Other symptoms and signs involving the musculoskeletal system: Secondary | ICD-10-CM | POA: Diagnosis not present

## 2024-01-01 NOTE — Therapy (Signed)
 OUTPATIENT PHYSICAL THERAPY LOWER EXTREMITY    Patient Name: Debbie Stevens MRN: [id number] DOB:16-Oct-1950, 73 y.o., female Today's Date: 01/01/2024  END OF SESSION:  PT End of Session - 01/01/24 0919     Visit Number 2    Number of Visits 13    Date for PT Re-Evaluation 02/06/24    Authorization Type MCR    Authorization Time Period 12/26/23 to 02/06/24    PT Start Time 0925    PT Stop Time 1005    PT Time Calculation (min) 40 min             Past Medical History:  Diagnosis Date   GERD (gastroesophageal reflux disease)    Headache    Hypertension    Past Surgical History:  Procedure Laterality Date   BILATERAL OOPHORECTOMY     KIDNEY SURGERY     right kidney removal.    Patient Active Problem List   Diagnosis Date Noted   Cervicalgia of occipito-atlanto-axial region 04/06/2021   Chronic tension-type headache, intractable 12/25/2019    PCP: Perley Bradley MD   REFERRING PROVIDER: Rance Burrows, MD  REFERRING DIAG: [phone] (ICD-10-CM) - Pain in right hip  THERAPY DIAG:  Pain in right leg  Muscle weakness (generalized)  Rationale for Evaluation and Treatment: Rehabilitation  ONSET DATE: 2 months   SUBJECTIVE:   SUBJECTIVE STATEMENT:  Pt states doing ex at home. Alittle pain with ex and walking     Right leg is hurting from the hip all the way down to the ground. No clear MOI, has to sit down deep into the couch which might be aggravating the hip as it is hard to get out of. Did have a fall, landed on all fours but didn't hit hip. No hx of back issues. Pain was severe 2 months ago, MD gave her some extra strength medicine   PERTINENT HISTORY: See above  PAIN:  Are you having pain? Yes RT hip 4/10   "Moderate 4-5/10  RLE, feels very stiff when getting up in the mornings/very difficult to describe. No significant change in pain that she's noticed but TM did make pain worse    PRECAUTIONS: None  RED FLAGS: None   WEIGHT BEARING  RESTRICTIONS: No  FALLS:  Has patient fallen in last 6 months? Yes. Number of falls 1, no FOF   LIVING ENVIRONMENT: Lives with: lives with their family Lives in: House/apartment Stairs: flight  OCCUPATION: retired  PLOF: Independent, Independent with basic ADLs, Independent with gait, and Independent with transfers  PATIENT GOALS: get rid of pain, be able to use TM again   NEXT MD VISIT: mid-June 2025  OBJECTIVE:  Note: Objective measures were completed at Evaluation unless otherwise noted.    PATIENT SURVEYS:   Attempted PSFS but unable to get accurate scores even with assistance of formal interpreter, deferred    COGNITION: Overall cognitive status: Within functional limits for tasks assessed       MUSCLE LENGTH:  R HS and piriformis mild limitation    LUMBAR AROM  Flexion WNL, some discomfort R LE; RFIS some possible radicular sx  Extension WNL no discomfort; REIS made pain worse  Lateral flexion mod limitation B  Rotation severe limitation B     LOWER EXTREMITY MMT:  MMT Right eval Left eval  Hip flexion 4+ 4+  Hip extension    Hip abduction 4+ 4+  Hip adduction    Hip internal rotation    Hip external rotation  Knee flexion 4+ 4+  Knee extension 4+ 4+  Ankle dorsiflexion    Ankle plantarflexion    Ankle inversion    Ankle eversion     (Blank rows = not tested)  LOWER EXTREMITY SPECIAL TESTS:   SLR test (-) R LE, FADIR (+) R LE, FABER (-) R LE, noted leg length difference with R LE longer than L, tends to have pain in R hip when laying on L side   FUNCTIONAL TESTS:    SLS no more than 5 seconds B                                                                                                                                   TREATMENT DATE:   01/01/24 Nustep L 4 5 min Feet on ball bridge,KTC and obl 10 x each Isometric ball squeeze 10 x then 10x with bridge Green tband clams 15 x then marching 20 x alt LE Red tband SLR 10 x  then SLR with abd 10 x PROM LE and trunk MET for RT ant rotation and issued as HEP    12/26/23  Eval, HEP, POC    PATIENT EDUCATION:  Education details: exam findings, HEP, POC  Person educated: Patient Education method: Explanation, Demonstration, and Handouts Education comprehension: verbalized understanding, returned demonstration, and needs further education  HOME EXERCISE PROGRAM:  Access Code: [email or web address] URL: [email or web address] Date: 12/26/2023 Prepared by: Terrel Ferries  Exercises - Hooklying Sacroiliac Joint Isometric  - 1-2 x daily - 7 x weekly - 1 sets - 6 reps - 5 seconds  hold - Supine Transversus Abdominis Bracing - Hands on Stomach  - 5 x daily - 7 x weekly - 1 sets - 10 reps - 5 seconds  hold - Seated Figure 4 Piriformis Stretch  - 1-2 x daily - 7 x weekly - 1 sets - 3 reps - 30 seconds  hold  ASSESSMENT:  CLINICAL IMPRESSION:  ant rotation RT LE that resolved with MET ,issued for HEP. Started SI stab ex and she did well with cuing and use of interpreter     Patient is a 72 y.o. F who was seen today for physical therapy evaluation and treatment for M25.551 (ICD-10-CM) - Pain in right hip. Objective findings as above I do think that she very likely has some SI involvement and this did respond moderately well to MET attempts this morning. Will make every effort to improve symptoms and assist in return to exercise.   OBJECTIVE IMPAIRMENTS: decreased activity tolerance, decreased balance, decreased mobility, difficulty walking, decreased ROM, decreased strength, hypomobility, increased fascial restrictions, increased muscle spasms, impaired flexibility, and pain.   ACTIVITY LIMITATIONS: sitting, standing, squatting, transfers, and locomotion level  PARTICIPATION LIMITATIONS: driving, shopping, community activity, and yard work  PERSONAL FACTORS: Education, Financial risk analyst, Past/current experiences, Social background, and Time since onset of  injury/illness/exacerbation are also affecting patient's functional outcome.   REHAB POTENTIAL: Good  CLINICAL  DECISION MAKING: Stable/uncomplicated  EVALUATION COMPLEXITY: Low   GOALS: Goals reviewed with patient? No  SHORT TERM GOALS: Target date: 01/16/2024   Will be compliant with appropriate progressive HEP  Baseline: Goal status: 01/01/24 MET  2.  SI joint involvement to have been resolved  Baseline:  Goal status: INITIAL  3.  Will demonstrate improved awareness of functional posture with all tasks  Baseline:  Goal status: INITIAL   LONG TERM GOALS: Target date: 02/06/2024    MMT to be 5/5 in all tested groups  Baseline:  Goal status: INITIAL  2.  Will be able to maintain SLS for 15 seconds B to show improved functional balance  Baseline:  Goal status: INITIAL  3.  Will have been able to return to exercise as desired with no pain above resting levels  Baseline:  Goal status: INITIAL  4.  Pain R LE to be no more than 2/10 at worst with all functional tasks and exercise  Baseline:  Goal status: INITIAL    PLAN:  PT FREQUENCY: 2x/week  PT DURATION: 6 weeks  PLANNED INTERVENTIONS: 97750- Physical Performance Testing, 97110-Therapeutic exercises, 97530- Therapeutic activity, [id number]- Neuromuscular re-education, 97535- Self Care, 09811- Manual therapy, 97033- Ionotophoresis 4mg /ml Dexamethasone, Taping, and Dry Needling  PLAN FOR NEXT SESSION: recheck SI, re-teach SI MET PRN. Core and proximal strength, lumbar and hip mobility/flexibilty    Patient Details  Name: Debbie Stevens MRN: [id number] Date of Birth: 1951/03/17 Referring Provider:  Rance Burrows, MD  Encounter Date: 01/01/2024   Aquilla Bayley, PTA 01/01/2024, 10:02 AM  [facility] at [facility] [address] [facility]. [place], [place], [address] Phone: [phone]   Fax:  [phone]

## 2024-01-03 ENCOUNTER — Telehealth: Payer: Self-pay | Admitting: Neurology

## 2024-01-03 NOTE — Telephone Encounter (Signed)
 Pt daughter called to reschedule Pt appt  due to provider .  Appt Scheduled .

## 2024-01-08 ENCOUNTER — Ambulatory Visit: Admitting: Physical Therapy

## 2024-01-08 DIAGNOSIS — M6281 Muscle weakness (generalized): Secondary | ICD-10-CM | POA: Diagnosis not present

## 2024-01-08 DIAGNOSIS — R29898 Other symptoms and signs involving the musculoskeletal system: Secondary | ICD-10-CM | POA: Diagnosis not present

## 2024-01-08 DIAGNOSIS — M79604 Pain in right leg: Secondary | ICD-10-CM | POA: Diagnosis not present

## 2024-01-08 NOTE — Therapy (Signed)
 OUTPATIENT PHYSICAL THERAPY LOWER EXTREMITY    Patient Name: Debbie Stevens MRN: [id number] DOB:April 24, 1951, 73 y.o., female Today's Date: 01/08/2024  END OF SESSION:  PT End of Session - 01/08/24 1337     Visit Number 3    Number of Visits 13    Date for PT Re-Evaluation 02/06/24    Authorization Type MCR    Authorization Time Period 12/26/23 to 02/06/24    PT Start Time 1350    PT Stop Time 1435    PT Time Calculation (min) 45 min             Past Medical History:  Diagnosis Date   GERD (gastroesophageal reflux disease)    Headache    Hypertension    Past Surgical History:  Procedure Laterality Date   BILATERAL OOPHORECTOMY     KIDNEY SURGERY     right kidney removal.    Patient Active Problem List   Diagnosis Date Noted   Cervicalgia of occipito-atlanto-axial region 04/06/2021   Chronic tension-type headache, intractable 12/25/2019    PCP: Perley Bradley MD   REFERRING PROVIDER: Rance Burrows, MD  REFERRING DIAG: [phone] (ICD-10-CM) - Pain in right hip  THERAPY DIAG:  Pain in right leg  Muscle weakness (generalized)  Rationale for Evaluation and Treatment: Rehabilitation  ONSET DATE: 2 months   SUBJECTIVE:   SUBJECTIVE STATEMENT: Doing ex at home 3 x a day and helping     Right leg is hurting from the hip all the way down to the ground. No clear MOI, has to sit down deep into the couch which might be aggravating the hip as it is hard to get out of. Did have a fall, landed on all fours but didn't hit hip. No hx of back issues. Pain was severe 2 months ago, MD gave her some extra strength medicine   PERTINENT HISTORY: See above  PAIN:  Are you having pain? Yes RT hip 3/10 with mvmt. None at rest   "Moderate 4-5/10  RLE, feels very stiff when getting up in the mornings/very difficult to describe. No significant change in pain that she's noticed but TM did make pain worse    PRECAUTIONS: None  RED FLAGS: None   WEIGHT BEARING  RESTRICTIONS: No  FALLS:  Has patient fallen in last 6 months? Yes. Number of falls 1, no FOF   LIVING ENVIRONMENT: Lives with: lives with their family Lives in: House/apartment Stairs: flight  OCCUPATION: retired  PLOF: Independent, Independent with basic ADLs, Independent with gait, and Independent with transfers  PATIENT GOALS: get rid of pain, be able to use TM again   NEXT MD VISIT: mid-June 2025  OBJECTIVE:  Note: Objective measures were completed at Evaluation unless otherwise noted.    PATIENT SURVEYS:   Attempted PSFS but unable to get accurate scores even with assistance of formal interpreter, deferred    COGNITION: Overall cognitive status: Within functional limits for tasks assessed       MUSCLE LENGTH:  R HS and piriformis mild limitation    LUMBAR AROM  Flexion WNL, some discomfort R LE; RFIS some possible radicular sx  Extension WNL no discomfort; REIS made pain worse  Lateral flexion mod limitation B  Rotation severe limitation B     LOWER EXTREMITY MMT:  MMT Right eval Left eval  Hip flexion 4+ 4+  Hip extension    Hip abduction 4+ 4+  Hip adduction    Hip internal rotation    Hip external rotation  Knee flexion 4+ 4+  Knee extension 4+ 4+  Ankle dorsiflexion    Ankle plantarflexion    Ankle inversion    Ankle eversion     (Blank rows = not tested)  LOWER EXTREMITY SPECIAL TESTS:   SLR test (-) R LE, FADIR (+) R LE, FABER (-) R LE, noted leg length difference with R LE longer than L, tends to have pain in R hip when laying on L side   FUNCTIONAL TESTS:    SLS no more than 5 seconds B                                                                                                                                   TREATMENT DATE:   01/08/24 Nustep L 5 Checked SI alignment = so progressed with SI/core stab ex Feet on ball bridge,KTC, obl, knee bent bridge 15 x Add ball squeeze 10 x the 10x with bridge Green  tband clams and hip flexion 2 sets 10 PROM BIL LE And trunk, no pain,end range tightness STS with wt ball chest press 10 x Leg press 3 sets 10 feet 3 way Standing red tband hip 3 way 10 x each Step ups opp leg ext 6 inch with HHA then laterally with opp leg abd 30# resiste dgait backward 5  then laterally 3 x  01/01/24 Nustep L 4 5 min Feet on ball bridge,KTC and obl 10 x each Isometric ball squeeze 10 x then 10x with bridge Green tband clams 15 x then marching 20 x alt LE Red tband SLR 10 x then SLR with abd 10 x PROM LE and trunk MET for RT ant rotation and issued as HEP    12/26/23  Eval, HEP, POC    PATIENT EDUCATION:  Education details: exam findings, HEP, POC  Person educated: Patient Education method: Explanation, Demonstration, and Handouts Education comprehension: verbalized understanding, returned demonstration, and needs further education  HOME EXERCISE PROGRAM:  Access Code: [email or web address] URL: [email or web address] Date: 12/26/2023 Prepared by: Terrel Ferries  Exercises - Hooklying Sacroiliac Joint Isometric  - 1-2 x daily - 7 x weekly - 1 sets - 6 reps - 5 seconds  hold - Supine Transversus Abdominis Bracing - Hands on Stomach  - 5 x daily - 7 x weekly - 1 sets - 10 reps - 5 seconds  hold - Seated Figure 4 Piriformis Stretch  - 1-2 x daily - 7 x weekly - 1 sets - 3 reps - 30 seconds  hold  ASSESSMENT:  CLINICAL IMPRESSION:  progressed core/ SI stab ex and she did well with cuing and use of interpreter. Pt verb pain is now only with mvmt but less  and at night she has to change positions frequently. Progressing with STGs     Patient is a 73 y.o. F who was seen today for physical therapy evaluation and treatment for M25.551 (ICD-10-CM) - Pain in right hip. Objective  findings as above I do think that she very likely has some SI involvement and this did respond moderately well to MET attempts this morning. Will make every effort to improve symptoms and  assist in return to exercise.   OBJECTIVE IMPAIRMENTS: decreased activity tolerance, decreased balance, decreased mobility, difficulty walking, decreased ROM, decreased strength, hypomobility, increased fascial restrictions, increased muscle spasms, impaired flexibility, and pain.   ACTIVITY LIMITATIONS: sitting, standing, squatting, transfers, and locomotion level  PARTICIPATION LIMITATIONS: driving, shopping, community activity, and yard work  PERSONAL FACTORS: Education, Financial risk analyst, Past/current experiences, Social background, and Time since onset of injury/illness/exacerbation are also affecting patient's functional outcome.   REHAB POTENTIAL: Good  CLINICAL DECISION MAKING: Stable/uncomplicated  EVALUATION COMPLEXITY: Low   GOALS: Goals reviewed with patient? No  SHORT TERM GOALS: Target date: 01/16/2024   Will be compliant with appropriate progressive HEP  Baseline: Goal status: 01/01/24 MET  2.  SI joint involvement to have been resolved  Baseline:  Goal status: 01/08/24 progressing  3.  Will demonstrate improved awareness of functional posture with all tasks  Baseline:  Goal status: 01/08/24 progressing   LONG TERM GOALS: Target date: 02/06/2024    MMT to be 5/5 in all tested groups  Baseline:  Goal status: INITIAL  2.  Will be able to maintain SLS for 15 seconds B to show improved functional balance  Baseline:  Goal status: INITIAL  3.  Will have been able to return to exercise as desired with no pain above resting levels  Baseline:  Goal status: INITIAL  4.  Pain R LE to be no more than 2/10 at worst with all functional tasks and exercise  Baseline:  Goal status: INITIAL    PLAN:  PT FREQUENCY: 2x/week  PT DURATION: 6 weeks  PLANNED INTERVENTIONS: 97750- Physical Performance Testing, 97110-Therapeutic exercises, 97530- Therapeutic activity, 97112- Neuromuscular re-education, 97535- Self Care, 13086- Manual therapy, [phone]- Ionotophoresis 4mg /ml  Dexamethasone, Taping, and Dry Needling  PLAN FOR NEXT SESSION: recheck SI, Core and proximal strength, lumbar and hip mobility/flexibilty    Patient Details  Name: Junita Kubota MRN: [id number] Date of Birth: 1951-05-23 Referring Provider:  Rance Burrows, MD  Encounter Date: 01/08/2024   Aquilla Bayley, PTA 01/08/2024, 1:40 PM  [facility] at [facility] [address] [facility] [facility] [place], [place], [address] Phone: [phone]   Fax:  [phone] at [facility] [address] [facility] [address]. [place], [place], [address] Phone: [phone]   Fax:  [phone]  Patient Details  Name: Danyia Borunda MRN: [id number] Date of Birth: 1951/03/16 Referring Provider:  Rance Burrows, MD  Encounter Date: 01/08/2024   Aquilla Bayley, PTA 01/08/2024, 1:40 PM  [facility] at [facility] [address] [facility]. [place], [place], [address] Phone: [phone]   Fax:  [phone]

## 2024-01-09 ENCOUNTER — Other Ambulatory Visit: Payer: Self-pay | Admitting: Adult Health

## 2024-01-10 ENCOUNTER — Ambulatory Visit: Admitting: Physical Therapy

## 2024-01-15 ENCOUNTER — Ambulatory Visit: Admitting: Physical Therapy

## 2024-01-15 DIAGNOSIS — M6281 Muscle weakness (generalized): Secondary | ICD-10-CM | POA: Diagnosis not present

## 2024-01-15 DIAGNOSIS — M79604 Pain in right leg: Secondary | ICD-10-CM

## 2024-01-15 DIAGNOSIS — R29898 Other symptoms and signs involving the musculoskeletal system: Secondary | ICD-10-CM | POA: Diagnosis not present

## 2024-01-15 NOTE — Therapy (Signed)
 OUTPATIENT PHYSICAL THERAPY LOWER EXTREMITY    Patient Name: Debbie Stevens MRN: [id number] DOB:1950-09-01, 73 y.o., female Today's Date: 01/15/2024  END OF SESSION:  PT End of Session - 01/15/24 1305     Visit Number 4    Number of Visits 13    Date for PT Re-Evaluation 02/06/24    Authorization Type MCR    Authorization Time Period 12/26/23 to 02/06/24    PT Start Time 1315    PT Stop Time 1400    PT Time Calculation (min) 45 min             Past Medical History:  Diagnosis Date   GERD (gastroesophageal reflux disease)    Headache    Hypertension    Past Surgical History:  Procedure Laterality Date   BILATERAL OOPHORECTOMY     KIDNEY SURGERY     right kidney removal.    Patient Active Problem List   Diagnosis Date Noted   Cervicalgia of occipito-atlanto-axial region 04/06/2021   Chronic tension-type headache, intractable 12/25/2019    PCP: Perley Bradley MD   REFERRING PROVIDER: Rance Burrows, MD  REFERRING DIAG: [phone] (ICD-10-CM) - Pain in right hip  THERAPY DIAG:  Pain in right leg  Muscle weakness (generalized)  Rationale for Evaluation and Treatment: Rehabilitation  ONSET DATE: 2 months   SUBJECTIVE:   SUBJECTIVE STATEMENT:  doing my HEP , feeling better. Pain just in RT buttock and down to knee at times, not all the way down leg anymore      Right leg is hurting from the hip all the way down to the ground. No clear MOI, has to sit down deep into the couch which might be aggravating the hip as it is hard to get out of. Did have a fall, landed on all fours but didn't hit hip. No hx of back issues. Pain was severe 2 months ago, MD gave her some extra strength medicine   PERTINENT HISTORY: See above  PAIN:  Are you having pain? Yes RT hip 3/10 with mvmt. None at rest   "Moderate 4-5/10  RLE, feels very stiff when getting up in the mornings/very difficult to describe. No significant change in pain that she's noticed but TM did make pain  worse    PRECAUTIONS: None  RED FLAGS: None   WEIGHT BEARING RESTRICTIONS: No  FALLS:  Has patient fallen in last 6 months? Yes. Number of falls 1, no FOF   LIVING ENVIRONMENT: Lives with: lives with their family Lives in: House/apartment Stairs: flight  OCCUPATION: retired  PLOF: Independent, Independent with basic ADLs, Independent with gait, and Independent with transfers  PATIENT GOALS: get rid of pain, be able to use TM again   NEXT MD VISIT: mid-June 2025  OBJECTIVE:  Note: Objective measures were completed at Evaluation unless otherwise noted.    PATIENT SURVEYS:   Attempted PSFS but unable to get accurate scores even with assistance of formal interpreter, deferred    COGNITION: Overall cognitive status: Within functional limits for tasks assessed       MUSCLE LENGTH:  R HS and piriformis mild limitation    LUMBAR AROM  Flexion WNL, some discomfort R LE; RFIS some possible radicular sx  Extension WNL no discomfort; REIS made pain worse  Lateral flexion mod limitation B  Rotation severe limitation B     LOWER EXTREMITY MMT:  MMT Right eval Left eval  Hip flexion 4+ 4+  Hip extension    Hip abduction 4+  4+  Hip adduction    Hip internal rotation    Hip external rotation    Knee flexion 4+ 4+  Knee extension 4+ 4+  Ankle dorsiflexion    Ankle plantarflexion    Ankle inversion    Ankle eversion     (Blank rows = not tested)  LOWER EXTREMITY SPECIAL TESTS:   SLR test (-) R LE, FADIR (+) R LE, FABER (-) R LE, noted leg length difference with R LE longer than L, tends to have pain in R hip when laying on L side   FUNCTIONAL TESTS:    SLS no more than 5 seconds B                                                                                                                                   TREATMENT DATE:   01/15/24 Nustep L 5 Standing on airex 6 inch step up 10x RT leg 10x Left leg Resisted gait fwd,back and  laterally Red tband hip flex,ext and abd 10 x BIL Feet on ball bridge,KTC, obl, knee bent bridge 15 x Wt ball OH to knee 10 x each Add ball squeeze 10 x the 10x with bridge Green tband clams and hip flexion 2 sets 10 PROM LE and trunk. Tightness end range RT HS and IT. Theragun to RT lateral HS Checked SI and = alignment with no pain or tenderness to touch  01/08/24 Nustep L 5 Checked SI alignment = so progressed with SI/core stab ex Feet on ball bridge,KTC, obl, knee bent bridge 15 x Add ball squeeze 10 x the 10x with bridge Green tband clams and hip flexion 2 sets 10 PROM BIL LE And trunk, no pain,end range tightness STS with wt ball chest press 10 x Leg press 3 sets 10 feet 3 way Standing red tband hip 3 way 10 x each Step ups opp leg ext 6 inch with HHA then laterally with opp leg abd 30# resiste dgait backward 5  then laterally 3 x  01/01/24 Nustep L 4 5 min Feet on ball bridge,KTC and obl 10 x each Isometric ball squeeze 10 x then 10x with bridge Green tband clams 15 x then marching 20 x alt LE Red tband SLR 10 x then SLR with abd 10 x PROM LE and trunk MET for RT ant rotation and issued as HEP    12/26/23  Eval, HEP, POC    PATIENT EDUCATION:  Education details: exam findings, HEP, POC  Person educated: Patient Education method: Explanation, Demonstration, and Handouts Education comprehension: verbalized understanding, returned demonstration, and needs further education  HOME EXERCISE PROGRAM:  Access Code: [email or web address] URL: [email or web address] Date: 12/26/2023 Prepared by: Terrel Ferries  Exercises - Hooklying Sacroiliac Joint Isometric  - 1-2 x daily - 7 x weekly - 1 sets - 6 reps - 5 seconds  hold - Supine Transversus Abdominis Bracing - Hands on Stomach  - 5  x daily - 7 x weekly - 1 sets - 10 reps - 5 seconds  hold - Seated Figure 4 Piriformis Stretch  - 1-2 x daily - 7 x weekly - 1 sets - 3 reps - 30 seconds   hold  ASSESSMENT:  CLINICAL IMPRESSION:  progressed core/ SI stab ex and she did well with cuing and use of interpreter. Tightness RT lateral HS and ITB-PROM,stretching and theragun. = SI alignment. All STGs met    Patient is a 73 y.o. F who was seen today for physical therapy evaluation and treatment for M25.551 (ICD-10-CM) - Pain in right hip. Objective findings as above I do think that she very likely has some SI involvement and this did respond moderately well to MET attempts this morning. Will make every effort to improve symptoms and assist in return to exercise.   OBJECTIVE IMPAIRMENTS: decreased activity tolerance, decreased balance, decreased mobility, difficulty walking, decreased ROM, decreased strength, hypomobility, increased fascial restrictions, increased muscle spasms, impaired flexibility, and pain.   ACTIVITY LIMITATIONS: sitting, standing, squatting, transfers, and locomotion level  PARTICIPATION LIMITATIONS: driving, shopping, community activity, and yard work  PERSONAL FACTORS: Education, Financial risk analyst, Past/current experiences, Social background, and Time since onset of injury/illness/exacerbation are also affecting patient's functional outcome.   REHAB POTENTIAL: Good  CLINICAL DECISION MAKING: Stable/uncomplicated  EVALUATION COMPLEXITY: Low   GOALS: Goals reviewed with patient? No  SHORT TERM GOALS: Target date: 01/16/2024   Will be compliant with appropriate progressive HEP  Baseline: Goal status: 01/01/24 MET  2.  SI joint involvement to have been resolved  Baseline:  Goal status: 01/08/24 progressing  01/14/14 MET  3.  Will demonstrate improved awareness of functional posture with all tasks  Baseline:  Goal status: 01/08/24 progressing  01/15/24 MET   LONG TERM GOALS: Target date: 02/06/2024    MMT to be 5/5 in all tested groups  Baseline:  Goal status: INITIAL  2.  Will be able to maintain SLS for 15 seconds B to show improved functional balance   Baseline:  Goal status: INITIAL  3.  Will have been able to return to exercise as desired with no pain above resting levels  Baseline:  Goal status: INITIAL  4.  Pain R LE to be no more than 2/10 at worst with all functional tasks and exercise  Baseline:  Goal status: INITIAL    PLAN:  PT FREQUENCY: 2x/week  PT DURATION: 6 weeks  PLANNED INTERVENTIONS: 97750- Physical Performance Testing, 97110-Therapeutic exercises, 97530- Therapeutic activity, 97112- Neuromuscular re-education, 97535- Self Care, 16109- Manual therapy, [phone]- Ionotophoresis 4mg /ml Dexamethasone, Taping, and Dry Needling  PLAN FOR NEXT SESSION: recheck SI, Core and proximal strength, lumbar and hip mobility/flexibilty    Patient Details  Name: Abryana Lykens MRN: [id number] Date of Birth: [birth year] Referring Provider:  Rance Burrows, MD  Encounter Date: 01/15/2024   Aquilla Bayley, PTA 01/15/2024, 1:06 PM  [facility] [address] [place], [place], [address] Phone: [phone]   Fax:  [phone] [address] [place], [place], [address] Phone: [phone]   Fax:  [phone]  Patient Details  Name: Debbie Stevens MRN: [id number] Date of Birth: [birth year] Referring Provider:  Rance Burrows, MD  Encounter Date: 01/15/2024   Aquilla Bayley, PTA 01/15/2024, 1:06 PM  [place] [facility] [address] [place], [place], [address] Phone: [phone]   Fax:  [phone] at [facility] [address] [place], [place], [address] Phone: [phone]   Fax:  [phone]

## 2024-01-17 ENCOUNTER — Ambulatory Visit: Admitting: Physical Therapy

## 2024-01-17 ENCOUNTER — Encounter: Payer: Self-pay | Admitting: Physical Therapy

## 2024-01-17 DIAGNOSIS — R29898 Other symptoms and signs involving the musculoskeletal system: Secondary | ICD-10-CM | POA: Diagnosis not present

## 2024-01-17 DIAGNOSIS — M79604 Pain in right leg: Secondary | ICD-10-CM | POA: Diagnosis not present

## 2024-01-17 DIAGNOSIS — M6281 Muscle weakness (generalized): Secondary | ICD-10-CM | POA: Diagnosis not present

## 2024-01-17 NOTE — Therapy (Signed)
 OUTPATIENT PHYSICAL THERAPY LOWER EXTREMITY    Patient Name: Debbie Stevens MRN: [id number] DOB:27-Jun-1951, 73 y.o., female Today's Date: 01/17/2024  END OF SESSION:  PT End of Session - 01/17/24 1442     Visit Number 5    Number of Visits 13    Date for PT Re-Evaluation 02/06/24    Authorization Type MCR    Authorization Time Period 12/26/23 to 02/06/24    Progress Note Due on Visit 10    PT Start Time 1432    PT Stop Time 1511    PT Time Calculation (min) 39 min    Activity Tolerance Patient tolerated treatment well    Behavior During Therapy WFL for tasks assessed/performed              Past Medical History:  Diagnosis Date   GERD (gastroesophageal reflux disease)    Headache    Hypertension    Past Surgical History:  Procedure Laterality Date   BILATERAL OOPHORECTOMY     KIDNEY SURGERY     right kidney removal.    Patient Active Problem List   Diagnosis Date Noted   Cervicalgia of occipito-atlanto-axial region 04/06/2021   Chronic tension-type headache, intractable 12/25/2019    PCP: Perley Bradley MD   REFERRING PROVIDER: Rance Burrows, MD  REFERRING DIAG: [phone] (ICD-10-CM) - Pain in right hip  THERAPY DIAG:  Pain in right leg  Muscle weakness (generalized)  Other symptoms and signs involving the musculoskeletal system  Rationale for Evaluation and Treatment: Rehabilitation  ONSET DATE: 2 months   SUBJECTIVE:   SUBJECTIVE STATEMENT:    Right leg is still bothering me at this point, HS hurts. Back to walking 20 min a day. Nothing is really hard, leg still just hurts. Feel about 50% better.      Right leg is hurting from the hip all the way down to the ground. No clear MOI, has to sit down deep into the couch which might be aggravating the hip as it is hard to get out of. Did have a fall, landed on all fours but didn't hit hip. No hx of back issues. Pain was severe 2 months ago, MD gave her some extra strength medicine   PERTINENT  HISTORY: See above  PAIN:  Are you having pain? In sitting 0/10, in standing pain can get to 3-4/10 difficult to describe "just pain". Not as painful to sit up now. Less pain when coughing.     PRECAUTIONS: None  RED FLAGS: None   WEIGHT BEARING RESTRICTIONS: No  FALLS:  Has patient fallen in last 6 months? Yes. Number of falls 1, no FOF   LIVING ENVIRONMENT: Lives with: lives with their family Lives in: House/apartment Stairs: flight  OCCUPATION: retired  PLOF: Independent, Independent with basic ADLs, Independent with gait, and Independent with transfers  PATIENT GOALS: get rid of pain, be able to use TM again   NEXT MD VISIT: mid-June 2025  OBJECTIVE:  Note: Objective measures were completed at Evaluation unless otherwise noted.    PATIENT SURVEYS:   Attempted PSFS but unable to get accurate scores even with assistance of formal interpreter, deferred    COGNITION: Overall cognitive status: Within functional limits for tasks assessed       MUSCLE LENGTH:  R HS and piriformis mild limitation    LUMBAR AROM  Eval:   Flexion WNL, some discomfort R LE; RFIS some possible radicular sx  Extension WNL no discomfort; REIS made pain worse  Lateral flexion  mod limitation B  Rotation severe limitation B   01/17/24  RFIS no change just HS tightness  REIS no change  Lateral flexion mild limitation B  Rotation mild limitation B      LOWER EXTREMITY MMT:  MMT Right eval Left eval  Hip flexion 4+ 4+  Hip extension    Hip abduction 4+ 4+  Hip adduction    Hip internal rotation    Hip external rotation    Knee flexion 4+ 4+  Knee extension 4+ 4+  Ankle dorsiflexion    Ankle plantarflexion    Ankle inversion    Ankle eversion     (Blank rows = not tested)  LOWER EXTREMITY SPECIAL TESTS:   SLR test (-) R LE, FADIR (+) R LE, FABER (-) R LE, noted leg length difference with R LE longer than L, tends to have pain in R hip when laying on L side    FUNCTIONAL TESTS:    SLS no more than 5 seconds B                                                                                                                                   TREATMENT DATE:    01/17/24  Nustep L5x8 minutes all four extremities   Lumbar ROM check Single leg bridges x10 B Hooklying HS stretches 3x30 seconds R LE  Demonstrated foam roller use on her own Hip hikes x10 B   Foam roller to R medial and lateral HS groups    01/15/24 Nustep L 5 Standing on airex 6 inch step up 10x RT leg 10x Left leg Resisted gait fwd,back and laterally Red tband hip flex,ext and abd 10 x BIL Feet on ball bridge,KTC, obl, knee bent bridge 15 x Wt ball OH to knee 10 x each Add ball squeeze 10 x the 10x with bridge Green tband clams and hip flexion 2 sets 10 PROM LE and trunk. Tightness end range RT HS and IT. Theragun to RT lateral HS Checked SI and = alignment with no pain or tenderness to touch  01/08/24 Nustep L 5 Checked SI alignment = so progressed with SI/core stab ex Feet on ball bridge,KTC, obl, knee bent bridge 15 x Add ball squeeze 10 x the 10x with bridge Green tband clams and hip flexion 2 sets 10 PROM BIL LE And trunk, no pain,end range tightness STS with wt ball chest press 10 x Leg press 3 sets 10 feet 3 way Standing red tband hip 3 way 10 x each Step ups opp leg ext 6 inch with HHA then laterally with opp leg abd 30# resiste dgait backward 5  then laterally 3 x  01/01/24 Nustep L 4 5 min Feet on ball bridge,KTC and obl 10 x each Isometric ball squeeze 10 x then 10x with bridge Green tband clams 15 x then marching 20 x alt LE Red tband SLR 10 x then  SLR with abd 10 x PROM LE and trunk MET for RT ant rotation and issued as HEP    12/26/23  Eval, HEP, POC    PATIENT EDUCATION:  Education details: exam findings, HEP, POC  Person educated: Patient Education method: Explanation, Demonstration, and Handouts Education comprehension:  verbalized understanding, returned demonstration, and needs further education  HOME EXERCISE PROGRAM:  Access Code: [email or web address] URL: [email or web address] Date: 01/17/2024 Prepared by: Terrel Ferries  Exercises - Hooklying Sacroiliac Joint Isometric  - 1-2 x daily - 7 x weekly - 1 sets - 6 reps - 5 seconds  hold - Supine Transversus Abdominis Bracing - Hands on Stomach  - 5 x daily - 7 x weekly - 1 sets - 10 reps - 5 seconds  hold - Seated Figure 4 Piriformis Stretch  - 1-2 x daily - 7 x weekly - 1 sets - 3 reps - 30 seconds  hold - Hooklying Hamstring Stretch with Strap  - 1-2 x daily - 7 x weekly - 1 sets - 3 reps - Hamstring Mobilization on Foam Roll  - 1 x daily - 7 x weekly - 1 sets - 1 reps - 5 minutes  hold - Supine Bridge  - 1 x daily - 7 x weekly - 1 sets - 10 reps  ASSESSMENT:  CLINICAL IMPRESSION:     Pt arrives today doing well, pain is headed in the correct direction and she feels about 50% better. Still having issues with R sided HS pain, looked at this and re-checked lumbar ROM today in a little more detail. May be OK for possible DC at end of next week, we will keep close tabs on how she is feeling.     Patient is a 73 y.o. F who was seen today for physical therapy evaluation and treatment for M25.551 (ICD-10-CM) - Pain in right hip. Objective findings as above I do think that she very likely has some SI involvement and this did respond moderately well to MET attempts this morning. Will make every effort to improve symptoms and assist in return to exercise.   OBJECTIVE IMPAIRMENTS: decreased activity tolerance, decreased balance, decreased mobility, difficulty walking, decreased ROM, decreased strength, hypomobility, increased fascial restrictions, increased muscle spasms, impaired flexibility, and pain.   ACTIVITY LIMITATIONS: sitting, standing, squatting, transfers, and locomotion level  PARTICIPATION LIMITATIONS: driving, shopping, community activity, and  yard work  PERSONAL FACTORS: Education, Financial risk analyst, Past/current experiences, Social background, and Time since onset of injury/illness/exacerbation are also affecting patient's functional outcome.   REHAB POTENTIAL: Good  CLINICAL DECISION MAKING: Stable/uncomplicated  EVALUATION COMPLEXITY: Low   GOALS: Goals reviewed with patient? No  SHORT TERM GOALS: Target date: 01/16/2024   Will be compliant with appropriate progressive HEP  Baseline: Goal status: 01/01/24 MET  2.  SI joint involvement to have been resolved  Baseline:  Goal status: 01/08/24 progressing  01/14/14 MET  3.  Will demonstrate improved awareness of functional posture with all tasks  Baseline:  Goal status: 01/08/24 progressing  01/15/24 MET   LONG TERM GOALS: Target date: 02/06/2024    MMT to be 5/5 in all tested groups  Baseline:  Goal status: INITIAL  2.  Will be able to maintain SLS for 15 seconds B to show improved functional balance  Baseline:  Goal status: INITIAL  3.  Will have been able to return to exercise as desired with no pain above resting levels  Baseline:  Goal status: INITIAL  4.  Pain R LE to be no more  than 2/10 at worst with all functional tasks and exercise  Baseline:  Goal status: INITIAL    PLAN:  PT FREQUENCY: 2x/week  PT DURATION: 6 weeks  PLANNED INTERVENTIONS: 97750- Physical Performance Testing, 97110-Therapeutic exercises, 97530- Therapeutic activity, [id number]- Neuromuscular re-education, 97535- Self Care, 10272- Manual therapy, 97033- Ionotophoresis 4mg /ml Dexamethasone, Taping, and Dry Needling  PLAN FOR NEXT SESSION: recheck SI PRN, Core and proximal strength, lumbar and hip mobility/flexibility, how did HS feel after more aggressive soft tissue work?    Terrel Ferries, PT, DPT 01/17/24 3:18 PM   [place] [facility] [address] [facility] [address] [facility] [place], [place], [address] Phone: [phone]   Fax:  [phone] [address] [place], [place], [address] Phone: [phone]   Fax:  [phone]

## 2024-01-22 ENCOUNTER — Encounter: Payer: Self-pay | Admitting: Physical Therapy

## 2024-01-22 ENCOUNTER — Ambulatory Visit: Attending: Family Medicine | Admitting: Physical Therapy

## 2024-01-22 DIAGNOSIS — R29898 Other symptoms and signs involving the musculoskeletal system: Secondary | ICD-10-CM | POA: Insufficient documentation

## 2024-01-22 DIAGNOSIS — M79604 Pain in right leg: Secondary | ICD-10-CM | POA: Insufficient documentation

## 2024-01-22 DIAGNOSIS — M6281 Muscle weakness (generalized): Secondary | ICD-10-CM | POA: Insufficient documentation

## 2024-01-22 NOTE — Therapy (Signed)
 OUTPATIENT PHYSICAL THERAPY LOWER EXTREMITY    Patient Name: Debbie Stevens MRN: [id number] DOB:[birth year], 73 y.o., female Today's Date: 01/22/2024  END OF SESSION:  PT End of Session - 01/22/24 1033     Visit Number 6    Number of Visits 13    Date for PT Re-Evaluation 02/06/24    Authorization Type MCR    Authorization Time Period 12/26/23 to 02/06/24    Progress Note Due on Visit 10    PT Start Time 1019    PT Stop Time 1058    PT Time Calculation (min) 39 min    Activity Tolerance Patient tolerated treatment well    Behavior During Therapy WFL for tasks assessed/performed               Past Medical History:  Diagnosis Date   GERD (gastroesophageal reflux disease)    Headache    Hypertension    Past Surgical History:  Procedure Laterality Date   BILATERAL OOPHORECTOMY     KIDNEY SURGERY     right kidney removal.    Patient Active Problem List   Diagnosis Date Noted   Cervicalgia of occipito-atlanto-axial region 04/06/2021   Chronic tension-type headache, intractable 12/25/2019    PCP: Perley Bradley MD   REFERRING PROVIDER: Rance Burrows, MD  REFERRING DIAG: [phone] (ICD-10-CM) - Pain in right hip  THERAPY DIAG:  Pain in right leg  Muscle weakness (generalized)  Other symptoms and signs involving the musculoskeletal system  Rationale for Evaluation and Treatment: Rehabilitation  ONSET DATE: 2 months   SUBJECTIVE:   SUBJECTIVE STATEMENT:    Bought a roller from [employer], have been using it and its helping- have been using it from back of leg down to calve. Feels about 60-70% better, roller is really helping. I'm better but I'm not 100%, calf is affected now- pain that was in HS is now in calf.       Right leg is hurting from the hip all the way down to the ground. No clear MOI, has to sit down deep into the couch which might be aggravating the hip as it is hard to get out of. Did have a fall, landed on all fours but didn't hit hip. No hx  of back issues. Pain was severe 2 months ago, MD gave her some extra strength medicine   PERTINENT HISTORY: See above  PAIN:  Are you having pain? 4-5/10 R HS and calf, "pain"; unclear worsening factors, foam roll helps     PRECAUTIONS: None  RED FLAGS: None   WEIGHT BEARING RESTRICTIONS: No  FALLS:  Has patient fallen in last 6 months? Yes. Number of falls 1, no FOF   LIVING ENVIRONMENT: Lives with: lives with their family Lives in: House/apartment Stairs: flight  OCCUPATION: retired  PLOF: Independent, Independent with basic ADLs, Independent with gait, and Independent with transfers  PATIENT GOALS: get rid of pain, be able to use TM again   NEXT MD VISIT: mid-June 2025  OBJECTIVE:  Note: Objective measures were completed at Evaluation unless otherwise noted.    PATIENT SURVEYS:   Attempted PSFS but unable to get accurate scores even with assistance of formal interpreter, deferred    COGNITION: Overall cognitive status: Within functional limits for tasks assessed       MUSCLE LENGTH:  R HS and piriformis mild limitation    LUMBAR AROM  Eval:   Flexion WNL, some discomfort R LE; RFIS some possible radicular sx  Extension WNL no  discomfort; REIS made pain worse  Lateral flexion mod limitation B  Rotation severe limitation B   01/17/24  RFIS no change just HS tightness  REIS no change  Lateral flexion mild limitation B  Rotation mild limitation B      LOWER EXTREMITY MMT:  MMT Right eval Left eval Right 01/22/24 Left 01/22/24  Hip flexion 4+ 4+ 4+ 5  Hip extension   3+ 3+  Hip abduction 4+ 4+ 5 4+  Hip adduction      Hip internal rotation      Hip external rotation      Knee flexion 4+ 4+ 4+ 5  Knee extension 4+ 4+ 5 5  Ankle dorsiflexion      Ankle plantarflexion      Ankle inversion      Ankle eversion       (Blank rows = not tested)  LOWER EXTREMITY SPECIAL TESTS:   SLR test (-) R LE, FADIR (+) R LE, FABER (-) R LE, noted leg  length difference with R LE longer than L, tends to have pain in R hip when laying on L side   FUNCTIONAL TESTS:    SLS no more than 5 seconds B                                                                                                                                   TREATMENT DATE:   01/22/24  Demonstrated foam roller for glutes (2 methods) Staggered bridges x10 B Standing hip extensions x10 B Standing hip hikes x10 B   MMT check, goal check Nustep L5x8 minutes BLEs only   Foam roller R HS, glute, calf        01/17/24  Nustep L5x8 minutes all four extremities   Lumbar ROM check Single leg bridges x10 B Hooklying HS stretches 3x30 seconds R LE  Demonstrated foam roller use on her own Hip hikes x10 B   Foam roller to R medial and lateral HS groups    01/15/24 Nustep L 5 Standing on airex 6 inch step up 10x RT leg 10x Left leg Resisted gait fwd,back and laterally Red tband hip flex,ext and abd 10 x BIL Feet on ball bridge,KTC, obl, knee bent bridge 15 x Wt ball OH to knee 10 x each Add ball squeeze 10 x the 10x with bridge Green tband clams and hip flexion 2 sets 10 PROM LE and trunk. Tightness end range RT HS and IT. Theragun to RT lateral HS Checked SI and = alignment with no pain or tenderness to touch  01/08/24 Nustep L 5 Checked SI alignment = so progressed with SI/core stab ex Feet on ball bridge,KTC, obl, knee bent bridge 15 x Add ball squeeze 10 x the 10x with bridge Green tband clams and hip flexion 2 sets 10 PROM BIL LE And trunk, no pain,end range tightness STS with wt ball chest press 10 x  Leg press 3 sets 10 feet 3 way Standing red tband hip 3 way 10 x each Step ups opp leg ext 6 inch with HHA then laterally with opp leg abd 30# resiste dgait backward 5  then laterally 3 x  01/01/24 Nustep L 4 5 min Feet on ball bridge,KTC and obl 10 x each Isometric ball squeeze 10 x then 10x with bridge Green tband clams 15 x then  marching 20 x alt LE Red tband SLR 10 x then SLR with abd 10 x PROM LE and trunk MET for RT ant rotation and issued as HEP    12/26/23  Eval, HEP, POC    PATIENT EDUCATION:  Education details: exam findings, HEP, POC  Person educated: Patient Education method: Explanation, Demonstration, and Handouts Education comprehension: verbalized understanding, returned demonstration, and needs further education  HOME EXERCISE PROGRAM:  Access Code: [email or web address] URL: [email or web address] Date: 01/17/2024 Prepared by: Terrel Ferries  Exercises - Hooklying Sacroiliac Joint Isometric  - 1-2 x daily - 7 x weekly - 1 sets - 6 reps - 5 seconds  hold - Supine Transversus Abdominis Bracing - Hands on Stomach  - 5 x daily - 7 x weekly - 1 sets - 10 reps - 5 seconds  hold - Seated Figure 4 Piriformis Stretch  - 1-2 x daily - 7 x weekly - 1 sets - 3 reps - 30 seconds  hold - Hooklying Hamstring Stretch with Strap  - 1-2 x daily - 7 x weekly - 1 sets - 3 reps - Hamstring Mobilization on Foam Roll  - 1 x daily - 7 x weekly - 1 sets - 1 reps - 5 minutes  hold - Supine Bridge  - 1 x daily - 7 x weekly - 1 sets - 10 reps  ASSESSMENT:  CLINICAL IMPRESSION:     Pt arrives today doing well, the foam roller is really helping her. Demonstrated additional foam roller techniques then updated MMT and HEP as well. Continued work on strength and manual. Approaching readiness for DC likely within an additional couple sessions. Really encouraged being more active at home,she is sitting for very long periods after eating especially when watching TV.    Patient is a 73 y.o. F who was seen today for physical therapy evaluation and treatment for M25.551 (ICD-10-CM) - Pain in right hip. Objective findings as above I do think that she very likely has some SI involvement and this did respond moderately well to MET attempts this morning. Will make every effort to improve symptoms and assist in return to exercise.    OBJECTIVE IMPAIRMENTS: decreased activity tolerance, decreased balance, decreased mobility, difficulty walking, decreased ROM, decreased strength, hypomobility, increased fascial restrictions, increased muscle spasms, impaired flexibility, and pain.   ACTIVITY LIMITATIONS: sitting, standing, squatting, transfers, and locomotion level  PARTICIPATION LIMITATIONS: driving, shopping, community activity, and yard work  PERSONAL FACTORS: Education, Financial risk analyst, Past/current experiences, Social background, and Time since onset of injury/illness/exacerbation are also affecting patient's functional outcome.   REHAB POTENTIAL: Good  CLINICAL DECISION MAKING: Stable/uncomplicated  EVALUATION COMPLEXITY: Low   GOALS: Goals reviewed with patient? No  SHORT TERM GOALS: Target date: 01/16/2024   Will be compliant with appropriate progressive HEP  Baseline: Goal status: 01/01/24 MET  2.  SI joint involvement to have been resolved  Baseline:  Goal status: 01/08/24 progressing  01/14/14 MET  3.  Will demonstrate improved awareness of functional posture with all tasks  Baseline:  Goal status: 01/08/24 progressing  01/15/24 MET  LONG TERM GOALS: Target date: 02/06/2024    MMT to be 5/5 in all tested groups  Baseline:  Goal status: ONGOING 01/22/24  2.  Will be able to maintain SLS for 15 seconds B to show improved functional balance  Baseline:  Goal status: INITIAL  3.  Will have been able to return to exercise as desired with no pain above resting levels  Baseline:  Goal status: ONGOING 01/22/24  4.  Pain R LE to be no more than 2/10 at worst with all functional tasks and exercise  Baseline:  Goal status: INITIAL    PLAN:  PT FREQUENCY: 2x/week  PT DURATION: 6 weeks  PLANNED INTERVENTIONS: 97750- Physical Performance Testing, 97110-Therapeutic exercises, 97530- Therapeutic activity, [id number]- Neuromuscular re-education, 97535- Self Care, 40981- Manual therapy, 97033- Ionotophoresis 4mg /ml  Dexamethasone, Taping, and Dry Needling  PLAN FOR NEXT SESSION: recheck SI PRN, Core and proximal strength, lumbar and hip mobility/flexibility, work on posterior chain strength, manual as appropriate/desired. Continue to encourage more activity/less sitting at home    Terrel Ferries, PT, DPT 01/22/24 10:59 AM   [facility] [address] [place], [place], [address] Phone: [phone]   Fax:  [phone] [address] [place], [place], [address] Phone: [phone]   Fax:  [phone]

## 2024-01-25 ENCOUNTER — Ambulatory Visit: Admitting: Physical Therapy

## 2024-01-25 DIAGNOSIS — M6281 Muscle weakness (generalized): Secondary | ICD-10-CM

## 2024-01-25 DIAGNOSIS — R29898 Other symptoms and signs involving the musculoskeletal system: Secondary | ICD-10-CM | POA: Diagnosis not present

## 2024-01-25 DIAGNOSIS — M79604 Pain in right leg: Secondary | ICD-10-CM | POA: Diagnosis not present

## 2024-01-25 NOTE — Therapy (Signed)
 OUTPATIENT PHYSICAL THERAPY LOWER EXTREMITY    Patient Name: Debbie Stevens MRN: [id number] DOB:September 15, 1950, 73 y.o., female Today's Date: 01/25/2024  END OF SESSION:  PT End of Session - 01/25/24 0835     Visit Number 7    Number of Visits 13    Date for PT Re-Evaluation 02/06/24    Authorization Type MCR    Authorization Time Period 12/26/23 to 02/06/24    PT Start Time 0835    PT Stop Time 0920    PT Time Calculation (min) 45 min               Past Medical History:  Diagnosis Date   GERD (gastroesophageal reflux disease)    Headache    Hypertension    Past Surgical History:  Procedure Laterality Date   BILATERAL OOPHORECTOMY     KIDNEY SURGERY     right kidney removal.    Patient Active Problem List   Diagnosis Date Noted   Cervicalgia of occipito-atlanto-axial region 04/06/2021   Chronic tension-type headache, intractable 12/25/2019    PCP: Perley Bradley MD   REFERRING PROVIDER: Rance Burrows, MD  REFERRING DIAG: [phone] (ICD-10-CM) - Pain in right hip  THERAPY DIAG:  Pain in right leg  Muscle weakness (generalized)  Rationale for Evaluation and Treatment: Rehabilitation  ONSET DATE: 2 months   SUBJECTIVE:   SUBJECTIVE STATEMENT:  overall much much better. Getting up more, avoiding prolonged sitting. Foam roller really helping. Still in RT side of buttocks   PERTINENT HISTORY: See above  PAIN:  Are you having pain? no   PRECAUTIONS: None  RED FLAGS: None   WEIGHT BEARING RESTRICTIONS: No  FALLS:  Has patient fallen in last 6 months? Yes. Number of falls 1, no FOF   LIVING ENVIRONMENT: Lives with: lives with their family Lives in: House/apartment Stairs: flight  OCCUPATION: retired  PLOF: Independent, Independent with basic ADLs, Independent with gait, and Independent with transfers  PATIENT GOALS: get rid of pain, be able to use TM again   NEXT MD VISIT: mid-June 2025  OBJECTIVE:  Note: Objective measures were  completed at Evaluation unless otherwise noted.    PATIENT SURVEYS:   Attempted PSFS but unable to get accurate scores even with assistance of formal interpreter, deferred    COGNITION: Overall cognitive status: Within functional limits for tasks assessed       MUSCLE LENGTH:  R HS and piriformis mild limitation    LUMBAR AROM  Eval:   Flexion WNL, some discomfort R LE; RFIS some possible radicular sx  Extension WNL no discomfort; REIS made pain worse  Lateral flexion mod limitation B  Rotation severe limitation B   01/17/24  RFIS no change just HS tightness  REIS no change  Lateral flexion mild limitation B  Rotation mild limitation B      LOWER EXTREMITY MMT:  MMT Right eval Left eval Right 01/22/24 Left 01/22/24  Hip flexion 4+ 4+ 4+ 5  Hip extension   3+ 3+  Hip abduction 4+ 4+ 5 4+  Hip adduction      Hip internal rotation      Hip external rotation      Knee flexion 4+ 4+ 4+ 5  Knee extension 4+ 4+ 5 5  Ankle dorsiflexion      Ankle plantarflexion      Ankle inversion      Ankle eversion       (Blank rows = not tested)  LOWER EXTREMITY SPECIAL TESTS:  SLR test (-) R LE, FADIR (+) R LE, FABER (-) R LE, noted leg length difference with R LE longer than L, tends to have pain in R hip when laying on L side   FUNCTIONAL TESTS:    SLS no more than 5 seconds B                                                                                                                                   TREATMENT DATE:   02-04-2024 Nustep L 5 5# dead lift 10 x 2 sets-pull in RT LE posterior 6 inch step up with opp leg extension 10 x BIL, then laterally with opp leg abd HS curl 20# 2 sets 10 LAQ 10# 2 sets 10 Leg Press  30# 2 sets 10, calf raises 2 sets 10 5# standing hip ext 10 x BIL 5# HS curl 10 x BIL 5# hip flex and abd 10 x BIL DSTW RT SI/glut- very tight and painful- issued tennis ball for home use    01/22/24  Demonstrated foam roller for  glutes (2 methods) Staggered bridges x10 B Standing hip extensions x10 B Standing hip hikes x10 B   MMT check, goal check Nustep L5x8 minutes BLEs only   Foam roller R HS, glute, calf        01/17/24  Nustep L5x8 minutes all four extremities   Lumbar ROM check Single leg bridges x10 B Hooklying HS stretches 3x30 seconds R LE  Demonstrated foam roller use on her own Hip hikes x10 B   Foam roller to R medial and lateral HS groups    01/15/24 Nustep L 5 Standing on airex 6 inch step up 10x RT leg 10x Left leg Resisted gait fwd,back and laterally Red tband hip flex,ext and abd 10 x BIL Feet on ball bridge,KTC, obl, knee bent bridge 15 x Wt ball OH to knee 10 x each Add ball squeeze 10 x the 10x with bridge Green tband clams and hip flexion 2 sets 10 PROM LE and trunk. Tightness end range RT HS and IT. Theragun to RT lateral HS Checked SI and = alignment with no pain or tenderness to touch  01/08/24 Nustep L 5 Checked SI alignment = so progressed with SI/core stab ex Feet on ball bridge,KTC, obl, knee bent bridge 15 x Add ball squeeze 10 x the 10x with bridge Green tband clams and hip flexion 2 sets 10 PROM BIL LE And trunk, no pain,end range tightness STS with wt ball chest press 10 x Leg press 3 sets 10 feet 3 way Standing red tband hip 3 way 10 x each Step ups opp leg ext 6 inch with HHA then laterally with opp leg abd 30# resiste dgait backward 5  then laterally 3 x  01/01/24 Nustep L 4 5 min Feet on ball bridge,KTC and obl 10 x each Isometric ball squeeze 10 x then 10x with  bridge Green tband clams 15 x then marching 20 x alt LE Red tband SLR 10 x then SLR with abd 10 x PROM LE and trunk MET for RT ant rotation and issued as HEP    12/26/23  Eval, HEP, POC    PATIENT EDUCATION:  Education details: exam findings, HEP, POC  Person educated: Patient Education method: Explanation, Demonstration, and Handouts Education comprehension:  verbalized understanding, returned demonstration, and needs further education  HOME EXERCISE PROGRAM:  Access Code: [email or web address] URL: [email or web address] Date: 01/17/2024 Prepared by: Terrel Ferries  Exercises - Hooklying Sacroiliac Joint Isometric  - 1-2 x daily - 7 x weekly - 1 sets - 6 reps - 5 seconds  hold - Supine Transversus Abdominis Bracing - Hands on Stomach  - 5 x daily - 7 x weekly - 1 sets - 10 reps - 5 seconds  hold - Seated Figure 4 Piriformis Stretch  - 1-2 x daily - 7 x weekly - 1 sets - 3 reps - 30 seconds  hold - Hooklying Hamstring Stretch with Strap  - 1-2 x daily - 7 x weekly - 1 sets - 3 reps - Hamstring Mobilization on Foam Roll  - 1 x daily - 7 x weekly - 1 sets - 1 reps - 5 minutes  hold - Supine Bridge  - 1 x daily - 7 x weekly - 1 sets - 10 reps  ASSESSMENT:  CLINICAL IMPRESSION:  pt arrives very pleased and states 90% better, foam roller really helping, some pain still in buttock. HS pull with some ex. Focus session on strengthening . DSTW to RT SI/piriformis and HS incertion- very tight and painful,issued tennis ball for self mobs with roller   OBJECTIVE IMPAIRMENTS: decreased activity tolerance, decreased balance, decreased mobility, difficulty walking, decreased ROM, decreased strength, hypomobility, increased fascial restrictions, increased muscle spasms, impaired flexibility, and pain.   ACTIVITY LIMITATIONS: sitting, standing, squatting, transfers, and locomotion level  PARTICIPATION LIMITATIONS: driving, shopping, community activity, and yard work  PERSONAL FACTORS: Education, Financial risk analyst, Past/current experiences, Social background, and Time since onset of injury/illness/exacerbation are also affecting patient's functional outcome.   REHAB POTENTIAL: Good  CLINICAL DECISION MAKING: Stable/uncomplicated  EVALUATION COMPLEXITY: Low   GOALS: Goals reviewed with patient? No  SHORT TERM GOALS: Target date: 01/16/2024   Will be compliant  with appropriate progressive HEP  Baseline: Goal status: 01/01/24 MET  2.  SI joint involvement to have been resolved  Baseline:  Goal status: 01/08/24 progressing  01/14/14 MET  3.  Will demonstrate improved awareness of functional posture with all tasks  Baseline:  Goal status: 01/08/24 progressing  01/15/24 MET   LONG TERM GOALS: Target date: 02/06/2024    MMT to be 5/5 in all tested groups  Baseline:  Goal status: ONGOING 01/22/24  2.  Will be able to maintain SLS for 15 seconds B to show improved functional balance  Baseline:  Goal status: INITIAL  3.  Will have been able to return to exercise as desired with no pain above resting levels  Baseline:  Goal status: ONGOING 01/22/24  4.  Pain R LE to be no more than 2/10 at worst with all functional tasks and exercise  Baseline:  Goal status: INITIAL    PLAN:  PT FREQUENCY: 2x/week  PT DURATION: 6 weeks  PLANNED INTERVENTIONS: 97750- Physical Performance Testing, 97110-Therapeutic exercises, 97530- Therapeutic activity, [id number]- Neuromuscular re-education, 97535- Self Care, 16109- Manual therapy, 97033- Ionotophoresis 4mg /ml Dexamethasone, Taping, and Dry Needling  PLAN FOR NEXT SESSION: asked pt  to wear tennis shoes next session and we can try TM    Isabella Mao PTA 01/25/24 8:36 AM   [facility] at [facility] [address] [place], [place], [address] Phone: [phone]   Fax:  [phone] at [facility] [address] [place], [place], [address] Phone: [phone]   Fax:  [phone]

## 2024-01-29 ENCOUNTER — Ambulatory Visit: Admitting: Physical Therapy

## 2024-01-29 DIAGNOSIS — M6281 Muscle weakness (generalized): Secondary | ICD-10-CM | POA: Diagnosis not present

## 2024-01-29 DIAGNOSIS — M79604 Pain in right leg: Secondary | ICD-10-CM

## 2024-01-29 DIAGNOSIS — R29898 Other symptoms and signs involving the musculoskeletal system: Secondary | ICD-10-CM | POA: Diagnosis not present

## 2024-01-29 NOTE — Therapy (Signed)
 OUTPATIENT PHYSICAL THERAPY LOWER EXTREMITY    Patient Name: Debbie Stevens MRN: [id number] DOB:Aug 13, 1951, 73 y.o., female Today's Date: 01/29/2024  END OF SESSION:  PT End of Session - 01/29/24 1313     Visit Number 8    Number of Visits 13    Date for PT Re-Evaluation 02/06/24    Authorization Type MCR    Authorization Time Period 12/26/23 to 02/06/24    PT Start Time 1310    PT Stop Time 1355    PT Time Calculation (min) 45 min               Past Medical History:  Diagnosis Date   GERD (gastroesophageal reflux disease)    Headache    Hypertension    Past Surgical History:  Procedure Laterality Date   BILATERAL OOPHORECTOMY     KIDNEY SURGERY     right kidney removal.    Patient Active Problem List   Diagnosis Date Noted   Cervicalgia of occipito-atlanto-axial region 04/06/2021   Chronic tension-type headache, intractable 12/25/2019    PCP: Perley Bradley MD   REFERRING PROVIDER: Rance Burrows, MD  REFERRING DIAG: [phone] (ICD-10-CM) - Pain in right hip  THERAPY DIAG:  Pain in right leg  Muscle weakness (generalized)  Rationale for Evaluation and Treatment: Rehabilitation  ONSET DATE: 2 months   SUBJECTIVE:   SUBJECTIVE STATEMENT:  so much better, no pain. TM past 3 days without pain ,working up to 30 min PERTINENT HISTORY: See above  PAIN:  Are you having pain? no   PRECAUTIONS: None  RED FLAGS: None   WEIGHT BEARING RESTRICTIONS: No  FALLS:  Has patient fallen in last 6 months? Yes. Number of falls 1, no FOF   LIVING ENVIRONMENT: Lives with: lives with their family Lives in: House/apartment Stairs: flight  OCCUPATION: retired  PLOF: Independent, Independent with basic ADLs, Independent with gait, and Independent with transfers  PATIENT GOALS: get rid of pain, be able to use TM again   NEXT MD VISIT: mid-June 2025  OBJECTIVE:  Note: Objective measures were completed at Evaluation unless otherwise noted.    PATIENT  SURVEYS:   Attempted PSFS but unable to get accurate scores even with assistance of formal interpreter, deferred    COGNITION: Overall cognitive status: Within functional limits for tasks assessed       MUSCLE LENGTH:  R HS and piriformis mild limitation    LUMBAR AROM  Eval:   Flexion WNL, some discomfort R LE; RFIS some possible radicular sx  Extension WNL no discomfort; REIS made pain worse  Lateral flexion mod limitation B  Rotation severe limitation B   01/17/24  RFIS no change just HS tightness  REIS no change  Lateral flexion mild limitation B  Rotation mild limitation B      LOWER EXTREMITY MMT:  MMT Right eval Left eval Right 01/22/24 Left 01/22/24  Hip flexion 4+ 4+ 4+ 5  Hip extension   3+ 3+  Hip abduction 4+ 4+ 5 4+  Hip adduction      Hip internal rotation      Hip external rotation      Knee flexion 4+ 4+ 4+ 5  Knee extension 4+ 4+ 5 5  Ankle dorsiflexion      Ankle plantarflexion      Ankle inversion      Ankle eversion       (Blank rows = not tested)  LOWER EXTREMITY SPECIAL TESTS:   SLR test (-) R  LE, FADIR (+) R LE, FABER (-) R LE, noted leg length difference with R LE longer than L, tends to have pain in R hip when laying on L side   FUNCTIONAL TESTS:    SLS no more than 5 seconds B                                                                                                                                   TREATMENT DATE:   01/29/24 TM 5 min 2 mph Resisted gait 4 ways 5 x each 30# 4 way cable pulleys 10# 10 x 6 inch step up with opp leg extension 10 x BIL, then laterally with opp leg abd, tried without UE support but LOB 5# dead lift 2 sets 10 HS curl 20# 2 sets 10 RT only 10 x  10# LAQ 10# 2 sets 10 RT LE only 10 x 5# Leg Press 40 # 2 sets 10. Calf raises 40# 2 sets 10    Feb 15, 2024 Nustep L 5 5# dead lift 10 x 2 sets-pull in RT LE posterior 6 inch step up with opp leg extension 10 x BIL, then laterally with opp  leg abd HS curl 20# 2 sets 10 LAQ 10# 2 sets 10 Leg Press  30# 2 sets 10, calf raises 2 sets 10 5# standing hip ext 10 x BIL 5# HS curl 10 x BIL 5# hip flex and abd 10 x BIL DSTW RT SI/glut- very tight and painful- issued tennis ball for home use    01/22/24  Demonstrated foam roller for glutes (2 methods) Staggered bridges x10 B Standing hip extensions x10 B Standing hip hikes x10 B   MMT check, goal check Nustep L5x8 minutes BLEs only   Foam roller R HS, glute, calf        01/17/24  Nustep L5x8 minutes all four extremities   Lumbar ROM check Single leg bridges x10 B Hooklying HS stretches 3x30 seconds R LE  Demonstrated foam roller use on her own Hip hikes x10 B   Foam roller to R medial and lateral HS groups    01/15/24 Nustep L 5 Standing on airex 6 inch step up 10x RT leg 10x Left leg Resisted gait fwd,back and laterally Red tband hip flex,ext and abd 10 x BIL Feet on ball bridge,KTC, obl, knee bent bridge 15 x Wt ball OH to knee 10 x each Add ball squeeze 10 x the 10x with bridge Green tband clams and hip flexion 2 sets 10 PROM LE and trunk. Tightness end range RT HS and IT. Theragun to RT lateral HS Checked SI and = alignment with no pain or tenderness to touch  01/08/24 Nustep L 5 Checked SI alignment = so progressed with SI/core stab ex Feet on ball bridge,KTC, obl, knee bent bridge 15 x Add ball squeeze 10 x the 10x with bridge Green tband clams and hip flexion 2  sets 10 PROM BIL LE And trunk, no pain,end range tightness STS with wt ball chest press 10 x Leg press 3 sets 10 feet 3 way Standing red tband hip 3 way 10 x each Step ups opp leg ext 6 inch with HHA then laterally with opp leg abd 30# resiste dgait backward 5  then laterally 3 x  01/01/24 Nustep L 4 5 min Feet on ball bridge,KTC and obl 10 x each Isometric ball squeeze 10 x then 10x with bridge Green tband clams 15 x then marching 20 x alt LE Red tband SLR 10 x then  SLR with abd 10 x PROM LE and trunk MET for RT ant rotation and issued as HEP    12/26/23  Eval, HEP, POC    PATIENT EDUCATION:  Education details: exam findings, HEP, POC  Person educated: Patient Education method: Explanation, Demonstration, and Handouts Education comprehension: verbalized understanding, returned demonstration, and needs further education  HOME EXERCISE PROGRAM:  Access Code: [email or web address] URL: [email or web address] Date: 01/17/2024 Prepared by: Terrel Ferries  Exercises - Hooklying Sacroiliac Joint Isometric  - 1-2 x daily - 7 x weekly - 1 sets - 6 reps - 5 seconds  hold - Supine Transversus Abdominis Bracing - Hands on Stomach  - 5 x daily - 7 x weekly - 1 sets - 10 reps - 5 seconds  hold - Seated Figure 4 Piriformis Stretch  - 1-2 x daily - 7 x weekly - 1 sets - 3 reps - 30 seconds  hold - Hooklying Hamstring Stretch with Strap  - 1-2 x daily - 7 x weekly - 1 sets - 3 reps - Hamstring Mobilization on Foam Roll  - 1 x daily - 7 x weekly - 1 sets - 1 reps - 5 minutes  hold - Supine Bridge  - 1 x daily - 7 x weekly - 1 sets - 10 reps  ASSESSMENT:  CLINICAL IMPRESSION:  pt arrives without pain, states feeling so much better and spot from last time is gone with using tennis ball. Progressed strengthening and added TM  OBJECTIVE IMPAIRMENTS: decreased activity tolerance, decreased balance, decreased mobility, difficulty walking, decreased ROM, decreased strength, hypomobility, increased fascial restrictions, increased muscle spasms, impaired flexibility, and pain.   ACTIVITY LIMITATIONS: sitting, standing, squatting, transfers, and locomotion level  PARTICIPATION LIMITATIONS: driving, shopping, community activity, and yard work  PERSONAL FACTORS: Education, Financial risk analyst, Past/current experiences, Social background, and Time since onset of injury/illness/exacerbation are also affecting patient's functional outcome.   REHAB POTENTIAL: Good  CLINICAL  DECISION MAKING: Stable/uncomplicated  EVALUATION COMPLEXITY: Low   GOALS: Goals reviewed with patient? No  SHORT TERM GOALS: Target date: 01/16/2024   Will be compliant with appropriate progressive HEP  Baseline: Goal status: 01/01/24 MET  2.  SI joint involvement to have been resolved  Baseline:  Goal status: 01/08/24 progressing  01/14/14 MET  3.  Will demonstrate improved awareness of functional posture with all tasks  Baseline:  Goal status: 01/08/24 progressing  01/15/24 MET   LONG TERM GOALS: Target date: 02/06/2024    MMT to be 5/5 in all tested groups  Baseline:  Goal status: ONGOING 01/22/24  2.  Will be able to maintain SLS for 15 seconds B to show improved functional balance  Baseline:  Goal status: INITIAL  3.  Will have been able to return to exercise as desired with no pain above resting levels  Baseline:  Goal status: ONGOING 01/22/24  4.  Pain R LE to be no more than  2/10 at worst with all functional tasks and exercise  Baseline:  Goal status: INITIAL    PLAN:  PT FREQUENCY: 2x/week  PT DURATION: 6 weeks  PLANNED INTERVENTIONS: 97750- Physical Performance Testing, 97110-Therapeutic exercises, 97530- Therapeutic activity, 97112- Neuromuscular re-education, 97535- Self Care, 16109- Manual therapy, [phone]- Ionotophoresis 4mg /ml Dexamethasone, Taping, and Dry Needling  PLAN FOR NEXT SESSION: D/C next session   Isabella Mao PTA 01/29/24 1:14 PM   [facility] [address] [place], [place], [address] Phone: [phone]   Fax:  [phone] [address] [place], [place], [address] Phone: [phone]   Fax:  [phone]

## 2024-02-01 ENCOUNTER — Ambulatory Visit: Admitting: Physical Therapy

## 2024-02-01 DIAGNOSIS — M6281 Muscle weakness (generalized): Secondary | ICD-10-CM | POA: Diagnosis not present

## 2024-02-01 DIAGNOSIS — M79604 Pain in right leg: Secondary | ICD-10-CM

## 2024-02-01 DIAGNOSIS — R29898 Other symptoms and signs involving the musculoskeletal system: Secondary | ICD-10-CM | POA: Diagnosis not present

## 2024-02-01 NOTE — Therapy (Signed)
 OUTPATIENT PHYSICAL THERAPY LOWER EXTREMITY    Patient Name: Debbie Stevens MRN: [id number] DOB:Mar 15, 1951, 73 y.o., female Today's Date: 02/01/2024  END OF SESSION:  PT End of Session - 02/01/24 0924     Visit Number 9    Number of Visits 13    Date for PT Re-Evaluation 02/06/24    Authorization Type MCR    Authorization Time Period 12/26/23 to 02/06/24    PT Start Time 0930    PT Stop Time 1010    PT Time Calculation (min) 40 min            Past Medical History:  Diagnosis Date   GERD (gastroesophageal reflux disease)    Headache    Hypertension    Past Surgical History:  Procedure Laterality Date   BILATERAL OOPHORECTOMY     KIDNEY SURGERY     right kidney removal.    Patient Active Problem List   Diagnosis Date Noted   Cervicalgia of occipito-atlanto-axial region 04/06/2021   Chronic tension-type headache, intractable 12/25/2019    PCP: Perley Bradley MD   REFERRING PROVIDER: Rance Burrows, MD  REFERRING DIAG: [phone] (ICD-10-CM) - Pain in right hip  THERAPY DIAG:  Pain in right leg  Muscle weakness (generalized)  Rationale for Evaluation and Treatment: Rehabilitation  ONSET DATE: 2 months   SUBJECTIVE:   SUBJECTIVE STATEMENT:  very pleased with leg, 80-90% better. Using TM at home PERTINENT HISTORY: See above  PAIN:  Are you having pain? no   PRECAUTIONS: None  RED FLAGS: None   WEIGHT BEARING RESTRICTIONS: No  FALLS:  Has patient fallen in last 6 months? Yes. Number of falls 1, no FOF   LIVING ENVIRONMENT: Lives with: lives with their family Lives in: House/apartment Stairs: flight  OCCUPATION: retired  PLOF: Independent, Independent with basic ADLs, Independent with gait, and Independent with transfers  PATIENT GOALS: get rid of pain, be able to use TM again   NEXT MD VISIT: mid-June 2025  OBJECTIVE:  Note: Objective measures were completed at Evaluation unless otherwise noted.    PATIENT SURVEYS:   Attempted  PSFS but unable to get accurate scores even with assistance of formal interpreter, deferred    COGNITION: Overall cognitive status: Within functional limits for tasks assessed       MUSCLE LENGTH:  R HS and piriformis mild limitation    LUMBAR AROM  Eval:   Flexion WNL, some discomfort R LE; RFIS some possible radicular sx  Extension WNL no discomfort; REIS made pain worse  Lateral flexion mod limitation B  Rotation severe limitation B   01/17/24  RFIS no change just HS tightness  REIS no change  Lateral flexion mild limitation B  Rotation mild limitation B      LOWER EXTREMITY MMT:  MMT Right eval Left eval Right 01/22/24 Left 01/22/24  Hip flexion 4+ 4+ 4+ 5  Hip extension   3+ 3+  Hip abduction 4+ 4+ 5 4+  Hip adduction      Hip internal rotation      Hip external rotation      Knee flexion 4+ 4+ 4+ 5  Knee extension 4+ 4+ 5 5  Ankle dorsiflexion      Ankle plantarflexion      Ankle inversion      Ankle eversion       (Blank rows = not tested)  LOWER EXTREMITY SPECIAL TESTS:   SLR test (-) R LE, FADIR (+) R LE, FABER (-) R LE,  noted leg length difference with R LE longer than L, tends to have pain in R hip when laying on L side   FUNCTIONAL TESTS:    SLS no more than 5 seconds B                                                                                                                                   TREATMENT DATE:   02/01/24 Nustep L 5 HS curl 20# 2 sets 10 RT only 10 x  10# LAQ 10# 2 sets 10 RT LE only 10 x 5# Leg Press 40 # 2 sets 10. Calf raises 40# 2 sets 10 Step up from airex onto 6 inch plus airex 10 x each leg 5# dead lift 2 sets 10 STW RT glut and HS   01/29/24 TM 5 min 2 mph Resisted gait 4 ways 5 x each 30# 4 way cable pulleys 10# 10 x 6 inch step up with opp leg extension 10 x BIL, then laterally with opp leg abd, tried without UE support but LOB 5# dead lift 2 sets 10 HS curl 20# 2 sets 10 RT only 10 x  10# LAQ  10# 2 sets 10 RT LE only 10 x 5# Leg Press 40 # 2 sets 10. Calf raises 40# 2 sets 10    Feb 07, 2024 Nustep L 5 5# dead lift 10 x 2 sets-pull in RT LE posterior 6 inch step up with opp leg extension 10 x BIL, then laterally with opp leg abd HS curl 20# 2 sets 10 LAQ 10# 2 sets 10 Leg Press  30# 2 sets 10, calf raises 2 sets 10 5# standing hip ext 10 x BIL 5# HS curl 10 x BIL 5# hip flex and abd 10 x BIL DSTW RT SI/glut- very tight and painful- issued tennis ball for home use    01/22/24  Demonstrated foam roller for glutes (2 methods) Staggered bridges x10 B Standing hip extensions x10 B Standing hip hikes x10 B   MMT check, goal check Nustep L5x8 minutes BLEs only   Foam roller R HS, glute, calf        01/17/24  Nustep L5x8 minutes all four extremities   Lumbar ROM check Single leg bridges x10 B Hooklying HS stretches 3x30 seconds R LE  Demonstrated foam roller use on her own Hip hikes x10 B   Foam roller to R medial and lateral HS groups    01/15/24 Nustep L 5 Standing on airex 6 inch step up 10x RT leg 10x Left leg Resisted gait fwd,back and laterally Red tband hip flex,ext and abd 10 x BIL Feet on ball bridge,KTC, obl, knee bent bridge 15 x Wt ball OH to knee 10 x each Add ball squeeze 10 x the 10x with bridge Green tband clams and hip flexion 2 sets 10 PROM LE and trunk. Tightness end range RT HS and IT. Theragun  to RT lateral HS Checked SI and = alignment with no pain or tenderness to touch  01/08/24 Nustep L 5 Checked SI alignment = so progressed with SI/core stab ex Feet on ball bridge,KTC, obl, knee bent bridge 15 x Add ball squeeze 10 x the 10x with bridge Green tband clams and hip flexion 2 sets 10 PROM BIL LE And trunk, no pain,end range tightness STS with wt ball chest press 10 x Leg press 3 sets 10 feet 3 way Standing red tband hip 3 way 10 x each Step ups opp leg ext 6 inch with HHA then laterally with opp leg abd 30#  resiste dgait backward 5  then laterally 3 x  01/01/24 Nustep L 4 5 min Feet on ball bridge,KTC and obl 10 x each Isometric ball squeeze 10 x then 10x with bridge Green tband clams 15 x then marching 20 x alt LE Red tband SLR 10 x then SLR with abd 10 x PROM LE and trunk MET for RT ant rotation and issued as HEP    12/26/23  Eval, HEP, POC    PATIENT EDUCATION:  Education details: exam findings, HEP, POC  Person educated: Patient Education method: Explanation, Demonstration, and Handouts Education comprehension: verbalized understanding, returned demonstration, and needs further education  HOME EXERCISE PROGRAM:  Access Code: [email or web address] URL: [email or web address] Date: 01/17/2024 Prepared by: Terrel Ferries  Exercises - Hooklying Sacroiliac Joint Isometric  - 1-2 x daily - 7 x weekly - 1 sets - 6 reps - 5 seconds  hold - Supine Transversus Abdominis Bracing - Hands on Stomach  - 5 x daily - 7 x weekly - 1 sets - 10 reps - 5 seconds  hold - Seated Figure 4 Piriformis Stretch  - 1-2 x daily - 7 x weekly - 1 sets - 3 reps - 30 seconds  hold - Hooklying Hamstring Stretch with Strap  - 1-2 x daily - 7 x weekly - 1 sets - 3 reps - Hamstring Mobilization on Foam Roll  - 1 x daily - 7 x weekly - 1 sets - 1 reps - 5 minutes  hold - Supine Bridge  - 1 x daily - 7 x weekly - 1 sets - 10 reps  ASSESSMENT:  CLINICAL IMPRESSION: pt states 80-90% better and that she is very pleased. All goals met. Strengthened today while we discussed maintanance at home and to call MD if flare up happens and she VU  OBJECTIVE IMPAIRMENTS: decreased activity tolerance, decreased balance, decreased mobility, difficulty walking, decreased ROM, decreased strength, hypomobility, increased fascial restrictions, increased muscle spasms, impaired flexibility, and pain.   ACTIVITY LIMITATIONS: sitting, standing, squatting, transfers, and locomotion level  PARTICIPATION LIMITATIONS: driving, shopping,  community activity, and yard work  PERSONAL FACTORS: Education, Financial risk analyst, Past/current experiences, Social background, and Time since onset of injury/illness/exacerbation are also affecting patient's functional outcome.   REHAB POTENTIAL: Good  CLINICAL DECISION MAKING: Stable/uncomplicated  EVALUATION COMPLEXITY: Low   GOALS: Goals reviewed with patient? No  SHORT TERM GOALS: Target date: 01/16/2024   Will be compliant with appropriate progressive HEP  Baseline: Goal status: 01/01/24 MET  2.  SI joint involvement to have been resolved  Baseline:  Goal status: 01/08/24 progressing  01/14/14 MET  3.  Will demonstrate improved awareness of functional posture with all tasks  Baseline:  Goal status: 01/08/24 progressing  01/15/24 MET   LONG TERM GOALS: Target date: 02/06/2024    MMT to be 5/5 in all tested groups  Baseline:  Goal  status: ONGOING 01/22/24  02/01/24 MET  2.  Will be able to maintain SLS for 15 seconds B to show improved functional balance  Baseline:  Goal status: 02/01/24 MET  3.  Will have been able to return to exercise as desired with no pain above resting levels  Baseline:  Goal status: ONGOING 01/22/24  02/01/24 MET  4.  Pain R LE to be no more than 2/10 at worst with all functional tasks and exercise  Baseline:  Goal status: 02/01/24 MET    PLAN:  PT FREQUENCY: 2x/week  PT DURATION: 6 weeks  PLANNED INTERVENTIONS: 97750- Physical Performance Testing, 97110-Therapeutic exercises, 97530- Therapeutic activity, 97112- Neuromuscular re-education, 97535- Self Care, 57846- Manual therapy, 97033- Ionotophoresis 4mg /ml Dexamethasone, Taping, and Dry Needling  PLAN FOR NEXT SESSION: D/C   PHYSICAL THERAPY DISCHARGE SUMMARY   Patient agrees to discharge. Patient goals were met. Patient is being discharged due to meeting the stated rehab goals.   Donavon Fudge, PT, DPT Shelvy Dickens Irie Fiorello PTA 02/01/24 10:59 AM   [facility]  [facility] [address] [place], [place], [address] Phone: [phone]   Fax:  [phone] [address] [address] [place], [place], [address] Phone: [phone]   Fax:  [phone] [facility] at [facility] [address] [place], [place], [address] Phone: [phone]   Fax:  [phone]

## 2024-02-04 DIAGNOSIS — M25561 Pain in right knee: Secondary | ICD-10-CM | POA: Diagnosis not present

## 2024-02-04 DIAGNOSIS — M25551 Pain in right hip: Secondary | ICD-10-CM | POA: Diagnosis not present

## 2024-02-06 DIAGNOSIS — R7303 Prediabetes: Secondary | ICD-10-CM | POA: Diagnosis not present

## 2024-02-06 DIAGNOSIS — N1831 Chronic kidney disease, stage 3a: Secondary | ICD-10-CM | POA: Diagnosis not present

## 2024-02-06 DIAGNOSIS — I10 Essential (primary) hypertension: Secondary | ICD-10-CM | POA: Diagnosis not present

## 2024-02-06 DIAGNOSIS — E559 Vitamin D deficiency, unspecified: Secondary | ICD-10-CM | POA: Diagnosis not present

## 2024-02-21 ENCOUNTER — Ambulatory Visit: Payer: Medicare Other | Admitting: Neurology

## 2024-03-03 ENCOUNTER — Telehealth: Payer: Self-pay | Admitting: Adult Health

## 2024-03-03 NOTE — Telephone Encounter (Signed)
 Request to be added to wait list

## 2024-03-19 ENCOUNTER — Ambulatory Visit: Admitting: Neurology

## 2024-04-02 ENCOUNTER — Other Ambulatory Visit: Payer: Self-pay | Admitting: Adult Health

## 2024-05-26 ENCOUNTER — Encounter: Payer: Self-pay | Admitting: Neurology

## 2024-05-26 ENCOUNTER — Ambulatory Visit: Admitting: Neurology

## 2024-05-26 VITALS — BP 123/85 | HR 89 | Ht 61.0 in | Wt 130.8 lb

## 2024-05-26 DIAGNOSIS — N1831 Chronic kidney disease, stage 3a: Secondary | ICD-10-CM | POA: Insufficient documentation

## 2024-05-26 DIAGNOSIS — E559 Vitamin D deficiency, unspecified: Secondary | ICD-10-CM | POA: Insufficient documentation

## 2024-05-26 DIAGNOSIS — E7800 Pure hypercholesterolemia, unspecified: Secondary | ICD-10-CM | POA: Insufficient documentation

## 2024-05-26 DIAGNOSIS — G44229 Chronic tension-type headache, not intractable: Secondary | ICD-10-CM

## 2024-05-26 MED ORDER — GABAPENTIN 100 MG PO CAPS: ORAL_CAPSULE | ORAL | 11 refills | Status: AC

## 2024-05-26 MED ORDER — AMITRIPTYLINE HCL 50 MG PO TABS: 50.0000 mg | ORAL_TABLET | Freq: Every evening | ORAL | 3 refills | Status: AC | PRN

## 2024-05-26 NOTE — Patient Instructions (Signed)
 ASSESSMENT AND PLAN :   73 y.o. year old female  here with:    1) improved cervical  pain and tension headaches,  non migrainous headaches.   2) continue with current medication, can reduce the gabapentin  to BID 100 mg for kidney protection, may wean down to 100 mg at night only if tolerated.    3) can stay on 50 mg elavil  at bedtime. Reports no confusion, no urinary retention and no dry mouth.   Rv prn in 12 months with NP      I would like to thank Cleotilde Planas, MD and Cleotilde Planas, Md [address] [place],  [place] [address] for allowing me to meet with this pleasant patient.

## 2024-05-26 NOTE — Progress Notes (Signed)
 Provider:  Dedra Gores, MD  Primary Care Physician:  Cleotilde Planas, MD [address] [place] [address]     Referring Provider: Cleotilde Planas, Md [address] [place],  [place] [address]          Chief Complaint according to patient   Patient presents with:                HISTORY OF PRESENT ILLNESS:  Danah Reinecke is a 73 y.o. female patient who is here for revisit 05/26/2024 for Tension Headaches- these reportedly have resolved  on 100 mg Gabapentin  AM,  200 MG in PM . She also takes a Tricylic amitriptyline  50 mg as medication at night, last refilled by Brena Russell, NP.  Sleeps well.   Chief concern according to patient :  none today, has enough medication on refill, would like to follow up  yearly.     Fam Hx : see previous note  Social HX; see previous note     Review of Systems: Out of a complete 14 system review, the patient complains of only the following symptoms, and all other reviewed systems are negative.:    Improved headaches.  Suspected to be more tension HA and less neuralgia.   Luke Awanda Velva Molinari is a 73 y.o. female with a history of cervicalgia and occipital neuralgia.. Returns today for follow-up. Reports that she is not currently in any pain but reports she typically gets discomfort around 3pm everyday. Reports that it starts in the neck and radiates up the center of the head to the parietal region. Reports a burning sensation. Gabapentin  was written for her to take AM and at noon however she reports that she has been taking in the AM and at bedtime. She remains on amitriptyline  50 mg at bedtime. She states that nerve blocks have helped in the past- although she was slightly confused on the mechanism of the nerve blocks- she thought it blocked blood flow. We discussed increasing gabapentin  dosage but patient seemed resistant. Reports tolerating gabapentin  well.      Social History   Socioeconomic History   Marital status:  Divorced    Spouse name: Not on file   Number of children: Not on file   Years of education: Not on file   Highest education level: Not on file  Occupational History   Not on file  Tobacco Use   Smoking status: Never   Smokeless tobacco: Never  Vaping Use   Vaping status: Never Used  Substance and Sexual Activity   Alcohol use: No   Drug use: No   Sexual activity: Not on file  Other Topics Concern   Not on file  Social History Narrative   Not on file   Social Drivers of Health   Financial Resource Strain: Medium Risk (09/19/2018)   Received from [facility]   Overall Financial Resource Strain (CARDIA)    Difficulty of Paying Living Expenses: Somewhat hard  Food Insecurity: No Food Insecurity (12/23/2021)   [facility] from [facility]   Hunger Vital Sign    Within the past 12 months, you worried that your food would run out before you got the money to buy more.: Never true    Within the past 12 months, the food you bought just didn't last and you didn't have money to get more.: Never true  Transportation Needs: No Transportation Needs (09/19/2018)   Received from [facility]   PRAPARE - Transportation    Lack of Transportation (Medical): No    Lack of Transportation (Non-Medical): No  Physical Activity: Insufficiently Active (09/19/2018)   Received from [facility]   Exercise Vital Sign    Days of Exercise per Week: 3 days    Minutes of Exercise per Session: 40 min  Stress: Not on file  Social Connections: Unknown (12/23/2021)   Received from [facility]   Social Network    Social Network: Not on file    Family History  Problem Relation Age of Onset   Headache Daughter     Past Medical History:  Diagnosis Date   GERD (gastroesophageal reflux disease)    Headache    Hypertension     Past Surgical History:  Procedure Laterality Date   BILATERAL OOPHORECTOMY     KIDNEY SURGERY     right kidney removal.      Current Outpatient Medications on File  Prior to Visit  Medication Sig Dispense Refill   acetaminophen  (TYLENOL  8 HOUR ARTHRITIS PAIN) 650 MG CR tablet Take 1 tablet (650 mg total) by mouth every 8 (eight) hours as needed for pain. 90 tablet 1   amitriptyline  (ELAVIL ) 50 MG tablet TAKE 1 TABLET BY MOUTH AT BEDTIME 60 tablet 0   Boswellia-Glucosamine-Vit D (GLUCOSAMINE COMPLEX PO) Take 1 tablet by mouth daily. Muscle/joint pain     Cholecalciferol 25 MCG (1000 UT) capsule Take 1,000 Units by mouth daily.     citalopram (CELEXA) 40 MG tablet Take 40 mg by mouth daily.     doxazosin (CARDURA) 2 MG tablet Take 2 mg by mouth 2 (two) times daily.     esomeprazole (NEXIUM) 40 MG capsule 40 mg daily at 12 noon.     gabapentin  (NEURONTIN ) 100 MG capsule Take 1 capsule in the a.m. and 2 capsules at 1 PM 90 capsule 6   hydrochlorothiazide (HYDRODIURIL) 25 MG tablet Take 25 mg by mouth daily.     losartan (COZAAR) 25 MG tablet Take 25 mg by mouth daily.     metoprolol tartrate (LOPRESSOR) 25 MG tablet Take 75 mg by mouth 2 (two) times daily.     Omega-3 Fatty Acids (FISH OIL) 1000 MG CAPS Take 1,000 mg by mouth daily.     pravastatin (PRAVACHOL) 40 MG tablet Take 40 mg by mouth daily.     sucralfate  (CARAFATE ) 1 g tablet Take 1 tablet (1 g total) by mouth 4 (four) times daily -  with meals and at bedtime. 60 tablet 0   No current facility-administered medications on file prior to visit.    Allergies  Allergen Reactions   Amlodipine Other (See Comments)    Flushing   Metoprolol Palpitations     DIAGNOSTIC DATA (LABS, IMAGING, TESTING) - I reviewed patient records, labs, notes, testing and imaging myself where available.  Lab Results  Component Value Date   WBC 6.8 07/27/2022   HGB 13.5 07/27/2022   HCT 40.2 07/27/2022   MCV 85.4 07/27/2022   PLT 206 07/27/2022      Component Value Date/Time   NA 133 (L) 07/27/2022 1837   K 3.4 (L) 07/27/2022 1837   CL 98 07/27/2022 1837   CO2 26 07/27/2022 1837   GLUCOSE 117 (H) 07/27/2022  1837   BUN 25 (H) 07/27/2022 1837   CREATININE 1.00 07/27/2022 1837   CALCIUM 9.4 07/27/2022 1837   PROT 7.7 12/13/2019 1602   ALBUMIN 4.4 12/13/2019 1602   AST 24 12/13/2019  1602   ALT 22 12/13/2019 1602   ALKPHOS 58 12/13/2019 1602   BILITOT 1.0 12/13/2019 1602   GFRNONAA 59 (L) 07/27/2022 1837   GFRAA 50 (L) 12/13/2019 1602   No results found for: CHOL, HDL, LDLCALC, LDLDIRECT, TRIG, CHOLHDL No results found for: YHAJ8R No results found for: VITAMINB12 No results found for: TSH  PHYSICAL EXAM:  Vitals:   05/26/24 1527  BP: 123/85  Pulse: 89  SpO2: 98%   No data found. Body mass index is 24.71 kg/m.   Wt Readings from Last 3 Encounters:  05/26/24 130 lb 12.8 oz (59.3 kg)  10/17/23 128 lb 9.6 oz (58.3 kg)  03/21/23 130 lb (59 kg)     Ht Readings from Last 3 Encounters:  05/26/24 5' 1 (1.549 m)  10/17/23 5' (1.524 m)  03/21/23 5' (1.524 m)      General: The patient is awake, alert and appears not in acute distress and groomed. Head: Normocephalic, atraumatic.  Pupils were equal round reactive to light. Extraocular movements were full, visual field were full on confrontational test. Facial sensation and strength were normal.  Head turning and shoulder shrug  were normal and symmetric. Motor: The motor testing reveals 5 over 5 strength of all 4 extremities. Good symmetric motor tone is noted throughout.  Sensory: Sensory testing is intact to soft touch on all 4 extremities. No evidence of extinction is noted.  Coordination: Cerebellar testing reveals good finger-nose-finger and heel-to-shin bilaterally.  Gait and station: Gait is normal. Reflexes: Deep tendon reflexes are symmetric and normal bilaterally.   ASSESSMENT AND PLAN :   73 y.o. year old female  here with:    1) improved cervical  pain and tension headaches non migrainous headaches.   2) continue with current medication, can reduce the gabapentin  to BID 100 mg for kidney protection,  may wean down to 100 mg at night only if tolerated.    3) can stay on 50 mg elavil  at bedtime- I wrote for PRN use at bedtime . Reports no confusion, no urinary retention and no dry mouth.   Rv prn in 12 months with NP    I would like to thank Cleotilde Planas, MD and Cleotilde Planas, Md [address] [place],  [place] [address] for allowing me to meet with this pleasant patient.  I would appreciate if the patient can have CMET/Bmet and CBC q 6 months through PCP.     The patient will be seen in follow-up in the sleep clinic at [facility] for discussion of test results,  related symptoms and treatment compliance review, further management strategies, etc.   The referring provider will be notified of the test results.   After spending a total time of  22  minutes face to face and time for  history taking, physical and neurologic examination, review of laboratory studies,  personal review of imaging studies, reports and results of other testing and review of referral information / records as far as provided in visit,   Electronically signed by: Dedra Gores, MD 05/26/2024 3:46 PM  [facility] and [facility] Sleep Board certified by The [employer] Sleep Medicine and Diplomate of the [employer] of Sleep Medicine. Board certified In Neurology through the ABPN, Fellow of the [employer] of Neu[employer].

## 2024-06-05 ENCOUNTER — Other Ambulatory Visit: Payer: Self-pay | Admitting: Adult Health

## 2024-06-09 ENCOUNTER — Other Ambulatory Visit: Payer: Self-pay | Admitting: Adult Health

## 2025-06-01 ENCOUNTER — Ambulatory Visit: Admitting: Adult Health
